# Patient Record
Sex: Male | Born: 1945
Health system: Southern US, Community
[De-identification: ages and names within clinical notes are randomized; demographics above are authoritative.]

## PROBLEM LIST (undated history)

## (undated) DIAGNOSIS — Z973 Presence of spectacles and contact lenses: Secondary | ICD-10-CM

## (undated) DIAGNOSIS — Z974 Presence of external hearing-aid: Secondary | ICD-10-CM

## (undated) DIAGNOSIS — E039 Hypothyroidism, unspecified: Secondary | ICD-10-CM

## (undated) DIAGNOSIS — S83206A Unspecified tear of unspecified meniscus, current injury, right knee, initial encounter: Secondary | ICD-10-CM

## (undated) HISTORY — PX: APPENDECTOMY: SHX54

## (undated) HISTORY — PX: TONSILLECTOMY: SUR1361

---

## 2004-12-18 ENCOUNTER — Ambulatory Visit (HOSPITAL_COMMUNITY): Admission: RE | Admit: 2004-12-18 | Discharge: 2004-12-18 | Payer: Self-pay | Admitting: *Deleted

## 2014-08-28 ENCOUNTER — Ambulatory Visit: Payer: Self-pay | Admitting: Orthopedic Surgery

## 2014-09-03 ENCOUNTER — Ambulatory Visit: Payer: Self-pay | Admitting: Orthopedic Surgery

## 2014-09-03 NOTE — H&P (Signed)
Dale BoastJames Miller is an 68 y.o. male.   Chief Complaint: R knee pain HPI: The patient is a 68 year old male who presents with knee complaints. The patient is seen today in referral from Dr. Zachery DauerBarnes. The patient reports right knee symptoms including: pain which began 4 week(s) (1/2) ago without any known injury. The patient describes the severity of the symptoms as mild (to severe). The patient describes their pain as aching. Prior to being seen today the patient was previously evaluated by by Dr. Zachery DauerBarnes. Previous work-up for this problem has included knee x-rays and knee MRI. Past treatment for this problem has included knee brace, intra-articular injection of corticosteroids and opioid analgesics. Symptoms are reported to be located in the right medial knee. Symptoms are exacerbated by weight bearing and walking. Symptoms are relieved by opioid analgesics. Current treatment includes knee brace and opioid analgesics (Norco).  No past medical history on file.  No past surgical history on file.  No family history on file. Social History:  has no tobacco, alcohol, and drug history on file.  Allergies: Allergies not on file   (Not in a hospital admission)  No results found for this or any previous visit (from the past 48 hour(s)). No results found.  Review of Systems  Constitutional: Negative.   HENT: Negative.   Eyes: Negative.   Respiratory: Negative.   Cardiovascular: Negative.   Gastrointestinal: Negative.   Genitourinary: Negative.   Musculoskeletal: Positive for joint pain.  Skin: Negative.   Neurological: Negative.   Psychiatric/Behavioral: Negative.     There were no vitals taken for this visit. Physical Exam  Constitutional: He is oriented to person, place, and time. He appears well-developed and well-nourished.  HENT:  Head: Normocephalic and atraumatic.  Eyes: Conjunctivae and EOM are normal. Pupils are equal, round, and reactive to light.  Neck: Normal range of motion. Neck  supple.  Cardiovascular: Normal rate and regular rhythm.   Respiratory: Effort normal and breath sounds normal.  GI: Soft. Bowel sounds are normal.  Musculoskeletal:  Tender R knee medial jt line Painful McMurrays PF crepitus Antalgic gait  Neurological: He is alert and oriented to person, place, and time. He has normal reflexes.  Skin: Skin is warm and dry.  Psychiatric: He has a normal mood and affect.    MRI R knee with MMT, full thickness chondral erosion posterior medial femoral condyle, weightbearing surface, and patella. Small free edge LMT.  Assessment/Plan R knee MMT  Dr. Shelle IronBeane had a long discussion with the patient concerning the risks and benefits of knee arthroscopy including help from the arthroscopic procedure as well as no help from the arthroscopic procedure or worsening of symptoms. Also discussed infection, DVT, PE, anesthetic complications, etc. Also discussed the possibility of repeat arthroscopic surgery required in the future or total knee replacement. I provided the patient with an illustrated handout and discussed it in detail as well as discussed the postoperative and perioperative courses and return to functional activities including work. Need for postoperative DVT prophylaxis was discussed as well.  Plan R knee arthroscopy, partial medial meniscectomy  Myrissa Chipley M. PA-C for Dr. Shelle IronBeane 09/03/2014, 1:07 PM

## 2014-09-04 ENCOUNTER — Encounter (HOSPITAL_BASED_OUTPATIENT_CLINIC_OR_DEPARTMENT_OTHER): Payer: Self-pay | Admitting: *Deleted

## 2014-09-04 NOTE — Progress Notes (Signed)
NPO AFTER MN WITH EXCEPTION CLEAR LIQUIDS UNTIL 0930 (NO CREAM/ MILK PRODUCTS) .  ARRIVE AT 1330. NEEDS HG. MAY TAKE HYDROCODONE DOS W/ SIPS OF WATER.

## 2014-09-06 ENCOUNTER — Encounter (HOSPITAL_BASED_OUTPATIENT_CLINIC_OR_DEPARTMENT_OTHER): Payer: 59 | Admitting: Anesthesiology

## 2014-09-06 ENCOUNTER — Encounter (HOSPITAL_BASED_OUTPATIENT_CLINIC_OR_DEPARTMENT_OTHER): Payer: Self-pay | Admitting: *Deleted

## 2014-09-06 ENCOUNTER — Ambulatory Visit (HOSPITAL_BASED_OUTPATIENT_CLINIC_OR_DEPARTMENT_OTHER)
Admission: RE | Admit: 2014-09-06 | Discharge: 2014-09-06 | Disposition: A | Discharge status: Home / Self Care | Payer: 59 | Source: Ambulatory Visit | Attending: Specialist | Admitting: Specialist

## 2014-09-06 ENCOUNTER — Ambulatory Visit (HOSPITAL_BASED_OUTPATIENT_CLINIC_OR_DEPARTMENT_OTHER): Payer: 59 | Admitting: Anesthesiology

## 2014-09-06 ENCOUNTER — Encounter (HOSPITAL_BASED_OUTPATIENT_CLINIC_OR_DEPARTMENT_OTHER)
Admission: RE | Disposition: A | Discharge status: Home / Self Care | Payer: Self-pay | Source: Ambulatory Visit | Attending: Specialist

## 2014-09-06 DIAGNOSIS — S83281A Other tear of lateral meniscus, current injury, right knee, initial encounter: Secondary | ICD-10-CM | POA: Diagnosis not present

## 2014-09-06 DIAGNOSIS — X58XXXA Exposure to other specified factors, initial encounter: Secondary | ICD-10-CM | POA: Insufficient documentation

## 2014-09-06 DIAGNOSIS — M1711 Unilateral primary osteoarthritis, right knee: Secondary | ICD-10-CM

## 2014-09-06 DIAGNOSIS — S83241A Other tear of medial meniscus, current injury, right knee, initial encounter: Secondary | ICD-10-CM | POA: Diagnosis not present

## 2014-09-06 DIAGNOSIS — M94261 Chondromalacia, right knee: Secondary | ICD-10-CM | POA: Insufficient documentation

## 2014-09-06 HISTORY — PX: KNEE ARTHROSCOPY WITH MEDIAL MENISECTOMY: SHX5651

## 2014-09-06 HISTORY — DX: Unspecified tear of unspecified meniscus, current injury, right knee, initial encounter: S83.206A

## 2014-09-06 HISTORY — DX: Presence of spectacles and contact lenses: Z97.3

## 2014-09-06 HISTORY — DX: Presence of external hearing-aid: Z97.4

## 2014-09-06 LAB — POCT HEMOGLOBIN-HEMACUE: HEMOGLOBIN: 15.9 g/dL (ref 13.0–17.0)

## 2014-09-06 SURGERY — ARTHROSCOPY, KNEE, WITH MEDIAL MENISCECTOMY
Anesthesia: General | Site: Knee | Laterality: Right

## 2014-09-06 MED ORDER — LACTATED RINGERS IV SOLN
INTRAVENOUS | Status: DC | PRN
  Administered 2014-09-06: 14:00:00 via INTRAVENOUS

## 2014-09-06 MED ORDER — LACTATED RINGERS IV SOLN
INTRAVENOUS | Status: DC
  Filled 2014-09-06: qty 1000

## 2014-09-06 MED ORDER — FENTANYL CITRATE 0.05 MG/ML IJ SOLN
INTRAMUSCULAR | Status: DC | PRN
  Administered 2014-09-06 (×8): 25 ug via INTRAVENOUS

## 2014-09-06 MED ORDER — FENTANYL CITRATE 0.05 MG/ML IJ SOLN
25.0000 ug | INTRAMUSCULAR | Status: DC | PRN
  Filled 2014-09-06: qty 1

## 2014-09-06 MED ORDER — EPINEPHRINE HCL 1 MG/ML IJ SOLN
INTRAMUSCULAR | Status: DC | PRN
  Administered 2014-09-06: 2 mg

## 2014-09-06 MED ORDER — KETOROLAC TROMETHAMINE 30 MG/ML IJ SOLN
INTRAMUSCULAR | Status: DC | PRN
  Administered 2014-09-06: 30 mg via INTRAVENOUS

## 2014-09-06 MED ORDER — DEXAMETHASONE SODIUM PHOSPHATE 4 MG/ML IJ SOLN
INTRAMUSCULAR | Status: DC | PRN
  Administered 2014-09-06: 10 mg via INTRAVENOUS

## 2014-09-06 MED ORDER — LIDOCAINE HCL (CARDIAC) 20 MG/ML IV SOLN
INTRAVENOUS | Status: DC | PRN
  Administered 2014-09-06: 100 mg via INTRAVENOUS

## 2014-09-06 MED ORDER — ONDANSETRON HCL 4 MG/2ML IJ SOLN
INTRAMUSCULAR | Status: DC | PRN
Start: 2014-09-06 — End: 2014-09-06
  Administered 2014-09-06: 4 mg via INTRAVENOUS

## 2014-09-06 MED ORDER — OXYCODONE-ACETAMINOPHEN 5-325 MG PO TABS: 1.0000 | ORAL_TABLET | ORAL | Status: DC | PRN

## 2014-09-06 MED ORDER — LACTATED RINGERS IV SOLN
INTRAVENOUS | Status: DC
  Administered 2014-09-06: 14:00:00 via INTRAVENOUS
  Filled 2014-09-06: qty 1000

## 2014-09-06 MED ORDER — SODIUM CHLORIDE 0.9 % IR SOLN
Status: DC | PRN
  Administered 2014-09-06: 6000 mL

## 2014-09-06 MED ORDER — ACETAMINOPHEN 10 MG/ML IV SOLN
INTRAVENOUS | Status: DC | PRN
  Administered 2014-09-06: 1000 mg via INTRAVENOUS

## 2014-09-06 MED ORDER — CEFAZOLIN SODIUM-DEXTROSE 2-3 GM-% IV SOLR
INTRAVENOUS | Status: AC
  Filled 2014-09-06: qty 50

## 2014-09-06 MED ORDER — CEFAZOLIN SODIUM-DEXTROSE 2-3 GM-% IV SOLR
2.0000 g | INTRAVENOUS | Status: AC
  Administered 2014-09-06: 2 g via INTRAVENOUS
  Filled 2014-09-06: qty 50

## 2014-09-06 MED ORDER — PROPOFOL 10 MG/ML IV BOLUS
INTRAVENOUS | Status: DC | PRN
  Administered 2014-09-06: 200 mg via INTRAVENOUS

## 2014-09-06 MED ORDER — MIDAZOLAM HCL 5 MG/5ML IJ SOLN
INTRAMUSCULAR | Status: DC | PRN
  Administered 2014-09-06 (×2): 1 mg via INTRAVENOUS

## 2014-09-06 MED ORDER — BUPIVACAINE-EPINEPHRINE 0.5% -1:200000 IJ SOLN
INTRAMUSCULAR | Status: DC | PRN
  Administered 2014-09-06: 30 mL

## 2014-09-06 MED ORDER — FENTANYL CITRATE 0.05 MG/ML IJ SOLN
INTRAMUSCULAR | Status: AC
  Filled 2014-09-06: qty 4

## 2014-09-06 MED ORDER — MIDAZOLAM HCL 2 MG/2ML IJ SOLN
INTRAMUSCULAR | Status: AC
  Filled 2014-09-06: qty 2

## 2014-09-06 SURGICAL SUPPLY — 44 items
BANDAGE ELASTIC 6 VELCRO ST LF (GAUZE/BANDAGES/DRESSINGS) ×3 IMPLANT
BLADE 4.2CUDA (BLADE) IMPLANT
BLADE CUDA SHAVER 3.5 (BLADE) ×3 IMPLANT
BLADE GREAT WHITE 4.2 (BLADE) IMPLANT
BLADE GREAT WHITE 4.2MM (BLADE)
BNDG COHESIVE 6X5 TAN NS LF (GAUZE/BANDAGES/DRESSINGS) ×3 IMPLANT
BOOTIES KNEE HIGH SLOAN (MISCELLANEOUS) ×3 IMPLANT
CANISTER SUCT LVC 12 LTR MEDI- (MISCELLANEOUS) ×3 IMPLANT
CANISTER SUCTION 1200CC (MISCELLANEOUS) IMPLANT
CANISTER SUCTION 2500CC (MISCELLANEOUS) IMPLANT
CANNULA ACUFLEX KIT 5X76 (CANNULA) IMPLANT
CLOTH BEACON ORANGE TIMEOUT ST (SAFETY) ×3 IMPLANT
CUTTER MENISCUS  4.2MM (BLADE)
CUTTER MENISCUS 4.2MM (BLADE) IMPLANT
DRAPE ARTHROSCOPY W/POUCH 114 (DRAPES) ×3 IMPLANT
DURAPREP 26ML APPLICATOR (WOUND CARE) ×3 IMPLANT
ELECT REM PT RETURN 9FT ADLT (ELECTROSURGICAL)
ELECTRODE REM PT RTRN 9FT ADLT (ELECTROSURGICAL) IMPLANT
GLOVE BIO SURGEON STRL SZ7.5 (GLOVE) ×3 IMPLANT
GLOVE SURG SS PI 7.5 STRL IVOR (GLOVE) ×3 IMPLANT
GLOVE SURG SS PI 8.0 STRL IVOR (GLOVE) ×3 IMPLANT
GOWN STRL REUS W/ TWL XL LVL3 (GOWN DISPOSABLE) ×2 IMPLANT
GOWN STRL REUS W/TWL XL LVL3 (GOWN DISPOSABLE) ×6
IV NS IRRIG 3000ML ARTHROMATIC (IV SOLUTION) ×6 IMPLANT
KNEE WRAP E Z 3 GEL PACK (MISCELLANEOUS) ×3 IMPLANT
MINI VAC (SURGICAL WAND) IMPLANT
NDL SAFETY ECLIPSE 18X1.5 (NEEDLE) ×1 IMPLANT
NEEDLE FILTER BLUNT 18X 1/2SAF (NEEDLE) ×2
NEEDLE FILTER BLUNT 18X1 1/2 (NEEDLE) ×1 IMPLANT
NEEDLE HYPO 18GX1.5 SHARP (NEEDLE) ×3
PACK ARTHROSCOPY DSU (CUSTOM PROCEDURE TRAY) ×3 IMPLANT
PACK BASIN DAY SURGERY FS (CUSTOM PROCEDURE TRAY) ×3 IMPLANT
PADDING CAST COTTON 6X4 STRL (CAST SUPPLIES) ×3 IMPLANT
RESECTOR FULL RADIUS 4.2MM (BLADE) IMPLANT
SET ARTHROSCOPY TUBING (MISCELLANEOUS) ×2
SET ARTHROSCOPY TUBING LN (MISCELLANEOUS) ×1 IMPLANT
SPONGE GAUZE 4X4 12PLY (GAUZE/BANDAGES/DRESSINGS) ×3 IMPLANT
SPONGE GAUZE 4X4 12PLY STER LF (GAUZE/BANDAGES/DRESSINGS) ×3 IMPLANT
SUT ETHILON 4 0 PS 2 18 (SUTURE) ×3 IMPLANT
SYR 30ML LL (SYRINGE) ×3 IMPLANT
SYRINGE 10CC LL (SYRINGE) ×3 IMPLANT
TOWEL OR 17X24 6PK STRL BLUE (TOWEL DISPOSABLE) ×3 IMPLANT
WAND 90 DEG TURBOVAC W/CORD (SURGICAL WAND) IMPLANT
WATER STERILE IRR 500ML POUR (IV SOLUTION) ×3 IMPLANT

## 2014-09-06 NOTE — Brief Op Note (Signed)
09/06/2014  4:13 PM  PATIENT:  Dale BoastJames Dier  68 y.o. male  PRE-OPERATIVE DIAGNOSIS:  right knee medial meniscal tear  POST-OPERATIVE DIAGNOSIS:  right knee medial meniscal tear  PROCEDURE:  Procedure(s): RIGHT KNEE ARTHROSCOPY WITH PARTIAL MEDIAL AND LATERAL MENISECTOMY WITH DEBRIDEMENT (Right)  SURGEON:  Surgeon(s) and Role:    * Javier DockerJeffrey C Xaine Sansom, MD - Primary  PHYSICIAN ASSISTANT:   ASSISTANTS: none   ANESTHESIA:   general  EBL:  Total I/O In: 200 [I.V.:200] Out: -   BLOOD ADMINISTERED:none  DRAINS: none   LOCAL MEDICATIONS USED:  MARCAINE     SPECIMEN:  No Specimen  DISPOSITION OF SPECIMEN:  N/A  COUNTS:  YES  TOURNIQUET:  * No tourniquets in log *  DICTATION: .Other Dictation: Dictation Number [id number]  PLAN OF CARE: Discharge to home after PACU  PATIENT DISPOSITION:  PACU - hemodynamically stable.   Delay start of Pharmacological VTE agent (>24hrs) due to surgical blood loss or risk of bleeding: no

## 2014-09-06 NOTE — Discharge Instructions (Signed)
ARTHROSCOPIC KNEE SURGERY HOME CARE INSTRUCTIONS ° ° °PAIN °You will be expected to have a moderate amount of pain in the affected knee for approximately two weeks.  However, the first two to four days will be the most severe in terms of the pain you will experience.  Prescriptions have been provided for you to take as needed for the pain.  The pain can be markedly reduced by using the ice/compressive bandage given.  Exchange the ice packs whenever they thaw.  During the night, keep the bandage on because it will still provide some compression for the swelling.  Also, keep the leg elevated on pillows above your heart, and this will help alleviate the pain and swelling. ° °MEDICATION °Prescriptions have been provided to take as needed for pain. To prevent blood clots, take Aspirin 325mg daily with a meal if not on a blood thinner and if no history of stomach ulcers. ° °ACTIVITY °It is preferred that you stay on bedrest for approximately 24 hours.  However, you may go to the bathroom with help.  After this, you can start to be up and about progressively more.  Remember that the swelling may still increase after three to four days if you are up and doing too much.  You may put as much weight on the affected leg as pain will allow.  Use your crutches for comfort and safety.  However, as soon as you are able, you may discard the crutches and go without them.  ° °DRESSING °Keep the current dressing as dry as possible.  Two days after your surgery, you may remove the ice/compressive wrap, and surgical dressing.  You may now take a shower, but do not scrub the sounds directly with soap.  Let water rinse over these and gently wipe with your hand.  Reapply band-aids over the puncture wounds and more gauze if needed.  A slight amount of thin drainage can be normal at this time, and do not let it frighten you.  Reapply the ice/compressive wrap.  You may now repeat this every day each time you shower. ° °SYMPTOMS TO REPORT TO  YOUR DOCTOR ° -Extreme pain. ° -Extreme swelling. ° -Temperature above 101 degrees that does not come down with acetaminophen     (Tylenol). ° -Any changes in the feeling, color or movement of your toes. ° -Extreme redness, heat, swelling or drainage at your incision ° °EXERCISE °It is preferred that you begin to exercise on the day of your surgery.  Straight leg raises and short arc quads should be begun the afternoon or evening of surgery and continued until you come back for your follow-up appointment.   Attached is an instruction sheet on how to perform these two simple exercises.  Do these at least three times per day if not more.  You may bend your knee as much as is comfortable.  The puncture wounds may occasionally be slightly uncomfortable with bending of the knee.  Do not let this frighten you.  It is important to keep your knee motion, but do not overdo it.  If you have significant pain, simply do not bend the knee as far.   You will be given more exercises to perform at your first return visit.   ° °RETURN APPOINTMENT °Please make an appointment to be seen by your doctor in 14 days from your surgery. ° °Patient Signature:  ________________________________________________________ ° °Nurse's Signature:  ________________________________________________________ ° ° °Post Anesthesia Home Care Instructions ° °Activity: °Get plenty of   rest for the remainder of the day. A responsible adult should stay with you for 24 hours following the procedure.  °For the next 24 hours, DO NOT: °-Drive a car °-Operate machinery °-Drink alcoholic beverages °-Take any medication unless instructed by your physician °-Make any legal decisions or sign important papers. ° °Meals: °Start with liquid foods such as gelatin or soup. Progress to regular foods as tolerated. Avoid greasy, spicy, heavy foods. If nausea and/or vomiting occur, drink only clear liquids until the nausea and/or vomiting subsides. Call your physician if vomiting  continues. ° °Special Instructions/Symptoms: °Your throat may feel dry or sore from the anesthesia or the breathing tube placed in your throat during surgery. If this causes discomfort, gargle with warm salt water. The discomfort should disappear within 24 hours. ° °

## 2014-09-06 NOTE — H&P (View-Only) (Signed)
Dale Miller is an 68 y.o. male.   Chief Complaint: R knee pain HPI: The patient is a 68 year old male who presents with knee complaints. The patient is seen today in referral from Dr. Barnes. The patient reports right knee symptoms including: pain which began 4 week(s) (1/2) ago without any known injury. The patient describes the severity of the symptoms as mild (to severe). The patient describes their pain as aching. Prior to being seen today the patient was previously evaluated by by Dr. Barnes. Previous work-up for this problem has included knee x-rays and knee MRI. Past treatment for this problem has included knee brace, intra-articular injection of corticosteroids and opioid analgesics. Symptoms are reported to be located in the right medial knee. Symptoms are exacerbated by weight bearing and walking. Symptoms are relieved by opioid analgesics. Current treatment includes knee brace and opioid analgesics (Norco).  No past medical history on file.  No past surgical history on file.  No family history on file. Social History:  has no tobacco, alcohol, and drug history on file.  Allergies: Allergies not on file   (Not in a hospital admission)  No results found for this or any previous visit (from the past 48 hour(s)). No results found.  Review of Systems  Constitutional: Negative.   HENT: Negative.   Eyes: Negative.   Respiratory: Negative.   Cardiovascular: Negative.   Gastrointestinal: Negative.   Genitourinary: Negative.   Musculoskeletal: Positive for joint pain.  Skin: Negative.   Neurological: Negative.   Psychiatric/Behavioral: Negative.     There were no vitals taken for this visit. Physical Exam  Constitutional: He is oriented to person, place, and time. He appears well-developed and well-nourished.  HENT:  Head: Normocephalic and atraumatic.  Eyes: Conjunctivae and EOM are normal. Pupils are equal, round, and reactive to light.  Neck: Normal range of motion. Neck  supple.  Cardiovascular: Normal rate and regular rhythm.   Respiratory: Effort normal and breath sounds normal.  GI: Soft. Bowel sounds are normal.  Musculoskeletal:  Tender R knee medial jt line Painful McMurrays PF crepitus Antalgic gait  Neurological: He is alert and oriented to person, place, and time. He has normal reflexes.  Skin: Skin is warm and dry.  Psychiatric: He has a normal mood and affect.    MRI R knee with MMT, full thickness chondral erosion posterior medial femoral condyle, weightbearing surface, and patella. Small free edge LMT.  Assessment/Plan R knee MMT  Dr. Beane had a long discussion with the patient concerning the risks and benefits of knee arthroscopy including help from the arthroscopic procedure as well as no help from the arthroscopic procedure or worsening of symptoms. Also discussed infection, DVT, PE, anesthetic complications, etc. Also discussed the possibility of repeat arthroscopic surgery required in the future or total knee replacement. I provided the patient with an illustrated handout and discussed it in detail as well as discussed the postoperative and perioperative courses and return to functional activities including work. Need for postoperative DVT prophylaxis was discussed as well.  Plan R knee arthroscopy, partial medial meniscectomy  BISSELL, JACLYN M. PA-C for Dr. Beane 09/03/2014, 1:07 PM    

## 2014-09-06 NOTE — Anesthesia Procedure Notes (Signed)
Procedure Name: LMA Insertion Date/Time: 09/06/2014 3:31 PM Performed by: Jessica PriestBEESON, Azar South C Pre-anesthesia Checklist: Patient identified, Emergency Drugs available, Suction available and Patient being monitored Patient Re-evaluated:Patient Re-evaluated prior to inductionOxygen Delivery Method: Circle System Utilized Preoxygenation: Pre-oxygenation with 100% oxygen Intubation Type: IV induction Ventilation: Mask ventilation without difficulty LMA: LMA inserted LMA Size: 4.0 Number of attempts: 1 Airway Equipment and Method: bite block Placement Confirmation: positive ETCO2 Tube secured with: Tape Dental Injury: Teeth and Oropharynx as per pre-operative assessment

## 2014-09-06 NOTE — Anesthesia Preprocedure Evaluation (Addendum)
Anesthesia Evaluation  Patient identified by MRN, date of birth, ID band Patient awake    Reviewed: Allergy & Precautions, H&P , NPO status , Patient's Chart, lab work & pertinent test results  Airway Mallampati: II  TM Distance: >3 FB Neck ROM: full    Dental no notable dental hx. (+) Teeth Intact, Dental Advisory Given   Pulmonary neg pulmonary ROS,  breath sounds clear to auscultation  Pulmonary exam normal       Cardiovascular Exercise Tolerance: Good negative cardio ROS  Rhythm:regular Rate:Normal     Neuro/Psych negative neurological ROS  negative psych ROS   GI/Hepatic negative GI ROS, Neg liver ROS,   Endo/Other  negative endocrine ROS  Renal/GU negative Renal ROS  negative genitourinary   Musculoskeletal   Abdominal   Peds  Hematology negative hematology ROS (+)   Anesthesia Other Findings   Reproductive/Obstetrics negative OB ROS                             Anesthesia Physical Anesthesia Plan  ASA: I  Anesthesia Plan: General   Post-op Pain Management:    Induction: Intravenous  Airway Management Planned: LMA  Additional Equipment:   Intra-op Plan:   Post-operative Plan:   Informed Consent: I have reviewed the patients History and Physical, chart, labs and discussed the procedure including the risks, benefits and alternatives for the proposed anesthesia with the patient or authorized representative who has indicated his/her understanding and acceptance.   Dental Advisory Given  Plan Discussed with: CRNA and Surgeon  Anesthesia Plan Comments:         Anesthesia Quick Evaluation  

## 2014-09-06 NOTE — Transfer of Care (Signed)
Immediate Anesthesia Transfer of Care Note  Patient: Dale Miller  Procedure(s) Performed: Procedure(s) (LRB): RIGHT KNEE ARTHROSCOPY WITH PARTIAL MEDIAL AND LATERAL MENISECTOMY WITH DEBRIDEMENT (Right)  Patient Location: PACU  Anesthesia Type: General  Level of Consciousness: awake, sedated, patient cooperative and responds to stimulation  Airway & Oxygen Therapy: Patient Spontanous Breathing and Patient connected to face mask oxygen  Post-op Assessment: Report given to PACU RN, Post -op Vital signs reviewed and stable and Patient moving all extremities  Post vital signs: Reviewed and stable  Complications: No apparent anesthesia complications

## 2014-09-06 NOTE — Interval H&P Note (Signed)
History and Physical Interval Note:  09/06/2014 1:00 PM  Dale Miller  has presented today for surgery, with the diagnosis of right knee medial meniscal tear  The various methods of treatment have been discussed with the patient and family. After consideration of risks, benefits and other options for treatment, the patient has consented to  Procedure(s): RIGHT KNEE ARTHROSCOPY WITH PARTIAL MEDIAL MENISECTOMY (Right) as a surgical intervention .  The patient's history has been reviewed, patient examined, no change in status, stable for surgery.  I have reviewed the patient's chart and labs.  Questions were answered to the patient's satisfaction.     Deadrian Toya C

## 2014-09-08 NOTE — Anesthesia Postprocedure Evaluation (Signed)
  Anesthesia Post-op Note  Patient: Dale Miller  Procedure(s) Performed: Procedure(s) (LRB): RIGHT KNEE ARTHROSCOPY WITH PARTIAL MEDIAL AND LATERAL MENISECTOMY WITH DEBRIDEMENT (Right)  Patient Location: PACU  Anesthesia Type: General  Level of Consciousness: awake and alert   Airway and Oxygen Therapy: Patient Spontanous Breathing  Post-op Pain: mild  Post-op Assessment: Post-op Vital signs reviewed, Patient's Cardiovascular Status Stable, Respiratory Function Stable, Patent Airway and No signs of Nausea or vomiting  Last Vitals:  Filed Vitals:   09/06/14 1800  BP: 153/74  Pulse: 70  Temp: 36.7 C  Resp: 18    Post-op Vital Signs: stable   Complications: No apparent anesthesia complications

## 2014-09-09 ENCOUNTER — Encounter (HOSPITAL_BASED_OUTPATIENT_CLINIC_OR_DEPARTMENT_OTHER): Payer: Self-pay | Admitting: Specialist

## 2014-09-09 NOTE — Op Note (Signed)
NAME:  Geralyn FlashROBSON, Jonpaul                ACCOUNT NO.:  [id number]  MEDICAL RECORD NO.:  [id number]  LOCATION:                                 FACILITY:  PHYSICIAN:  Jene EveryJeffrey Zooey Schreurs, M.D.    DATE OF BIRTH:  1946-08-19  DATE OF PROCEDURE:  09/06/2014 DATE OF DISCHARGE:  09/06/2014                              OPERATIVE REPORT   PREOPERATIVE DIAGNOSIS:  Medial meniscus tear of the right knee.  POSTOPERATIVE DIAGNOSIS:  Medial meniscus tear of the right knee, grade 3 chondromalacia, medial femoral condyle, medial tibial plateau and patellofemoral joint.  PROCEDURE PERFORMED:  Right knee arthroscopy, partial medial meniscectomy, chondroplasty, medial femoral condyle, patella.  ANESTHESIA:  General.  ASSISTANT:  No assistant.  HISTORY:  A 68, locking, popping, giving way secondary to medial meniscal tear, confirmed by MRI indicated for arthroscopy and partial medial meniscectomy.  Risks and benefits discussed including bleeding, infection, damage to neurovascular structures, DVT, PE, anesthetic complications, etc.  TECHNIQUE:  Patient in supine position, after induction of adequate general anesthesia, 2 g Kefzol, the right lower extremity was prepped and draped in usual sterile fashion.  A lateral parapatellar portal was fashioned with a #11 blade.  Ingress cannula atraumatically placed. Irrigant was utilized to insufflate the joint.  Under direct visualization, a medial parapatellar portal was fashioned with a #11 blade after localization with 18-gauge needle sparing the medial meniscus, complex tear of the posterior third of the medial meniscus was noted.  I introduced a basket and resected 50% of the posterior half, further contoured with 3.5 Cuda shaver to a stable base.  There was a large chondral defect in medial femoral condyle, light chondroplasty performed.  Mild chondral changes in tibial plateau, light chondroplasty there.  ACL was unremarkable.  Lateral compartment  revealed tearing of the anterior horn lateral meniscus that shaved this to a stable base.  Chondral surfaces were relatively unremarkable.  Suprapatellar pouch grade 3 changes of patella, light chondroplasty performed here.  Normal patellofemoral tracking.  Gutters were unremarkable, revisited all compartments.  No further pathology amenable to arthroscopic intervention.  I, therefore, removed all instrumentation.  Portals were closed with 4-0 nylon simple sutures 0.25% Marcaine with epinephrine was infiltrated in the joint.  Wound was dressed sterilely, awoken without difficulty, and transported to the recovery room in satisfactory condition.  The patient tolerated the procedure well.  No complications.  No assistant.  Minimal blood loss.     Jene EveryJeffrey Hamilton Marinello, M.D.     Cordelia PenJB/MEDQ  D:  09/06/2014  T:  09/07/2014  Job:  [id number]

## 2017-02-03 DIAGNOSIS — R1013 Epigastric pain: Secondary | ICD-10-CM | POA: Diagnosis not present

## 2017-02-03 DIAGNOSIS — R5383 Other fatigue: Secondary | ICD-10-CM | POA: Diagnosis not present

## 2017-02-08 DIAGNOSIS — M1711 Unilateral primary osteoarthritis, right knee: Secondary | ICD-10-CM | POA: Diagnosis not present

## 2017-02-08 DIAGNOSIS — M1712 Unilateral primary osteoarthritis, left knee: Secondary | ICD-10-CM | POA: Diagnosis not present

## 2017-02-08 DIAGNOSIS — M170 Bilateral primary osteoarthritis of knee: Secondary | ICD-10-CM | POA: Diagnosis not present

## 2017-03-21 DIAGNOSIS — H02839 Dermatochalasis of unspecified eye, unspecified eyelid: Secondary | ICD-10-CM | POA: Diagnosis not present

## 2017-03-21 DIAGNOSIS — H21233 Degeneration of iris (pigmentary), bilateral: Secondary | ICD-10-CM | POA: Diagnosis not present

## 2017-03-21 DIAGNOSIS — H2512 Age-related nuclear cataract, left eye: Secondary | ICD-10-CM | POA: Diagnosis not present

## 2017-03-29 DIAGNOSIS — H2512 Age-related nuclear cataract, left eye: Secondary | ICD-10-CM | POA: Diagnosis not present

## 2017-04-05 DIAGNOSIS — Z13220 Encounter for screening for lipoid disorders: Secondary | ICD-10-CM | POA: Diagnosis not present

## 2017-04-05 DIAGNOSIS — Z0000 Encounter for general adult medical examination without abnormal findings: Secondary | ICD-10-CM | POA: Diagnosis not present

## 2017-06-08 DIAGNOSIS — M1711 Unilateral primary osteoarthritis, right knee: Secondary | ICD-10-CM | POA: Diagnosis not present

## 2017-06-08 DIAGNOSIS — M1712 Unilateral primary osteoarthritis, left knee: Secondary | ICD-10-CM | POA: Diagnosis not present

## 2017-06-14 ENCOUNTER — Ambulatory Visit: Payer: Self-pay | Admitting: Orthopedic Surgery

## 2017-06-29 NOTE — Patient Instructions (Addendum)
Dale BoastJames Miller  06/29/2017   Your procedure is scheduled on: 07-07-17   [facility]  [facility] to 3rd floor to  Short Stay Center at 8:15 AM.   Call this number if you have problems the morning of surgery [phone]    Remember: ONLY 1 PERSON MAY GO WITH YOU TO SHORT STAY TO GET  READY MORNING OF YOUR SURGERY.  Do not eat food or drink liquids :After Midnight.     Take these medicines the morning of surgery with A SIP OF WATER: Synthroid                                You may not have any metal on your body including hair pins and              piercings  Do not wear jewelry, make-up, lotions, powders or perfumes, deodorant             Men may shave face and neck.   Do not bring valuables to the hospital. [place] IS NOT             RESPONSIBLE   FOR VALUABLES.  Contacts, dentures or bridgework may not be worn into surgery.  Leave suitcase in the car. After surgery it may be brought to your room.                 Please read over the following fact sheets you were given: _____________________________________________________________________           [facility] Before surgery, you can play an important role.  Because skin is not sterile, your skin needs to be as free of germs as possible.  You can reduce the number of germs on your skin by washing with CHG (chlorahexidine gluconate) soap before surgery.  CHG is an antiseptic cleaner which kills germs and bonds with the skin to continue killing germs even after washing. Please DO NOT use if you have an allergy to CHG or antibacterial soaps.  If your skin becomes reddened/irritated stop using the CHG and inform your nurse when you arrive at Short Stay. Do not shave (including legs and underarms) for at least 48 hours prior to the first CHG shower.  You may shave your face/neck. Please follow these instructions carefully:  1.  Shower with CHG Soap the  night before surgery and the  morning of Surgery.  2.  If you choose to wash your hair, wash your hair first as usual with your  normal  shampoo.  3.  After you shampoo, rinse your hair and body thoroughly to remove the  shampoo.                           4.  Use CHG as you would any other liquid soap.  You can apply chg directly  to the skin and wash                       Gently with a scrungie or clean washcloth.  5.  Apply the CHG Soap to your body ONLY FROM THE NECK DOWN.   Do not use on face/ open  Wound or open sores. Avoid contact with eyes, ears mouth and genitals (private parts).                       Wash face,  Genitals (private parts) with your normal soap.             6.  Wash thoroughly, paying special attention to the area where your surgery  will be performed.  7.  Thoroughly rinse your body with warm water from the neck down.  8.  DO NOT shower/wash with your normal soap after using and rinsing off  the CHG Soap.                9.  Pat yourself dry with a clean towel.            10.  Wear clean pajamas.            11.  Place clean sheets on your bed the night of your first shower and do not  sleep with pets. Day of Surgery : Do not apply any lotions/deodorants the morning of surgery.  Please wear clean clothes to the hospital/surgery center.  FAILURE TO FOLLOW THESE INSTRUCTIONS MAY RESULT IN THE CANCELLATION OF YOUR SURGERY PATIENT SIGNATURE_________________________________  NURSE SIGNATURE__________________________________  ________________________________________________________________________   [facility]  An incentive spirometer is a tool that can help keep your lungs clear and active. This tool measures how well you are filling your lungs with each breath. Taking long deep breaths may help reverse or decrease the chance of developing breathing (pulmonary) problems (especially infection) following:  A long period of time when you  are unable to move or be active. BEFORE THE PROCEDURE   If the spirometer includes an indicator to show your best effort, your nurse or respiratory therapist will set it to a desired goal.  If possible, sit up straight or lean slightly forward. Try not to slouch.  Hold the incentive spirometer in an upright position. INSTRUCTIONS FOR USE  1. Sit on the edge of your bed if possible, or sit up as far as you can in bed or on a chair. 2. Hold the incentive spirometer in an upright position. 3. Breathe out normally. 4. Place the mouthpiece in your mouth and seal your lips tightly around it. 5. Breathe in slowly and as deeply as possible, raising the piston or the ball toward the top of the column. 6. Hold your breath for 3-5 seconds or for as long as possible. Allow the piston or ball to fall to the bottom of the column. 7. Remove the mouthpiece from your mouth and breathe out normally. 8. Rest for a few seconds and repeat Steps 1 through 7 at least 10 times every 1-2 hours when you are awake. Take your time and take a few normal breaths between deep breaths. 9. The spirometer may include an indicator to show your best effort. Use the indicator as a goal to work toward during each repetition. 10. After each set of 10 deep breaths, practice coughing to be sure your lungs are clear. If you have an incision (the cut made at the time of surgery), support your incision when coughing by placing a pillow or rolled up towels firmly against it. Once you are able to get out of bed, walk around indoors and cough well. You may stop using the incentive spirometer when instructed by your caregiver.  RISKS AND COMPLICATIONS  Take your time so you do not get  dizzy or light-headed.  If you are in pain, you may need to take or ask for pain medication before doing incentive spirometry. It is harder to take a deep breath if you are having pain. AFTER USE  Rest and breathe slowly and easily.  It can be helpful to  keep track of a log of your progress. Your caregiver can provide you with a simple table to help with this. If you are using the spirometer at home, follow these instructions: [place] IF:   You are having difficultly using the spirometer.  You have trouble using the spirometer as often as instructed.  Your pain medication is not giving enough relief while using the spirometer.  You develop fever of 100.5 F (38.1 C) or higher. SEEK IMMEDIATE MEDICAL CARE IF:   You cough up bloody sputum that had not been present before.  You develop fever of 102 F (38.9 C) or greater.  You develop worsening pain at or near the incision site. MAKE SURE YOU:   Understand these instructions.  Will watch your condition.  Will get help right away if you are not doing well or get worse. Document Released: 03/14/2007 Document Revised: 01/24/2012 Document Reviewed: 05/15/2007 [facility] Patient Information 2014 [place], [place].   ________________________________________________________________________

## 2017-06-30 ENCOUNTER — Encounter (INDEPENDENT_AMBULATORY_CARE_PROVIDER_SITE_OTHER): Payer: Self-pay

## 2017-06-30 ENCOUNTER — Encounter (HOSPITAL_COMMUNITY)
Admission: RE | Admit: 2017-06-30 | Discharge: 2017-06-30 | Disposition: A | Discharge status: Home / Self Care | Payer: 59 | Source: Ambulatory Visit | Attending: Specialist | Admitting: Specialist

## 2017-06-30 ENCOUNTER — Encounter (HOSPITAL_COMMUNITY): Payer: Self-pay

## 2017-06-30 ENCOUNTER — Ambulatory Visit: Payer: Self-pay | Admitting: Orthopedic Surgery

## 2017-06-30 DIAGNOSIS — Z01812 Encounter for preprocedural laboratory examination: Secondary | ICD-10-CM | POA: Insufficient documentation

## 2017-06-30 DIAGNOSIS — M1711 Unilateral primary osteoarthritis, right knee: Secondary | ICD-10-CM | POA: Diagnosis not present

## 2017-06-30 HISTORY — DX: Hypothyroidism, unspecified: E03.9

## 2017-06-30 LAB — PROTIME-INR
INR: 0.99
Prothrombin Time: 13.1 seconds (ref 11.4–15.2)

## 2017-06-30 LAB — BASIC METABOLIC PANEL
ANION GAP: 8 (ref 5–15)
BUN: 20 mg/dL (ref 6–20)
CHLORIDE: 106 mmol/L (ref 101–111)
CO2: 28 mmol/L (ref 22–32)
Calcium: 9.5 mg/dL (ref 8.9–10.3)
Creatinine, Ser: 1.17 mg/dL (ref 0.61–1.24)
GFR calc Af Amer: >60 mL/min (ref >60)
GFR calc non Af Amer: >60 mL/min (ref >60)
GLUCOSE: 101 mg/dL — AB (ref 65–99)
POTASSIUM: 5.3 mmol/L — AB (ref 3.5–5.1)
Sodium: 142 mmol/L (ref 135–145)

## 2017-06-30 LAB — URINALYSIS, ROUTINE W REFLEX MICROSCOPIC
Bilirubin Urine: NEGATIVE
GLUCOSE, UA: NEGATIVE mg/dL
Hgb urine dipstick: NEGATIVE
Ketones, ur: 5 mg/dL — AB
LEUKOCYTES UA: NEGATIVE
Nitrite: NEGATIVE
PROTEIN: NEGATIVE mg/dL
Specific Gravity, Urine: 1.023 (ref 1.005–1.030)
pH: 6 (ref 5.0–8.0)

## 2017-06-30 LAB — APTT: aPTT: 27 seconds (ref 24–36)

## 2017-06-30 LAB — CBC
HEMATOCRIT: 50 % (ref 39.0–52.0)
HEMOGLOBIN: 17.1 g/dL — AB (ref 13.0–17.0)
MCH: 32.3 pg (ref 26.0–34.0)
MCHC: 34.2 g/dL (ref 30.0–36.0)
MCV: 94.3 fL (ref 78.0–100.0)
Platelets: 184 10*3/uL (ref 150–400)
RBC: 5.3 MIL/uL (ref 4.22–5.81)
RDW: 12.7 % (ref 11.5–15.5)
WBC: 9.9 10*3/uL (ref 4.0–10.5)

## 2017-06-30 NOTE — H&P (Signed)
Dale Miller DOB: Aug 12, 1946 Married / Language: [facility] Miller / Race: White Male  H&P Date: 06/30/17  Chief Complaint: Right knee pain  History of Present Illness The patient is a 71 year old male who comes in today for a preoperative History and Physical. The patient is scheduled for a right total knee arthroplasty to be performed by Dr. Javier Docker, MD at [facility] on 07/07/17 .   Dale Miller reports ongoing, progressively worsening right knee pain refractory to conservative treatment including arthroscopy, steroid injections, viscous supplementation, bracing, quad strengthening, home exercise program, medications, relative rest and activity modification. Pain is interfering with his quality-of-life and activities of daily living at this point and he has elected to proceed with total knee replacement.  Dr. Shelle Miller and the patient mutually agreed to proceed with a total knee replacement. Risks and benefits of the procedure were discussed including stiffness, suboptimal range of motion, persistent pain, infection requiring removal of prosthesis and reinsertion, need for prophylactic antibiotics in the future, for example, dental procedures, possible need for manipulation, revision in the future and also anesthetic complications including DVT, PE, etc. We discussed the perioperative course, time in the hospital, postoperative recovery and the need for elevation to control swelling. We also discussed the predicted range of motion and the probability that squatting and kneeling would be unobtainable in the future. In addition, postoperative anticoagulation was discussed. We have obtained preoperative medical clearance as necessary. Provided illustrated handout and discussed it in detail. They will enroll in the total joint replacement educational forum at the hospital.  He had his preop appointment at the hospital this morning.  Problem List/Past Medical Hx Chondromalacia, patella, right  (M22.41)  Knee internal derangement, right (M23.91)  S/P Arthroscopic Partial Medial Menisectomy (Z61.096)  Acute pain of left knee (M25.562)  Encounter for care following Knee Arthroscopy (Z47.89)  Degeneration of meniscus of knee, right (M23.306)  Lumbar spine pain (M54.5)  Primary osteoarthritis of right knee (M17.11)  Spinal stenosis, lumbar (M48.061)  Derangement of posterior horn of medial meniscus of right knee (M23.321)  Lumbar DDD (M51.36)   Allergies No Known Drug Allergies [08/10/2014]:  Family History  First Degree Relatives  Cancer  Mother. Cerebrovascular Accident  Maternal Grandmother.  Social History  Tobacco use  Never smoker. 08/10/2014  Medication History Vitamin B12 (Oral) Specific strength unknown - Active. Prevagen (10MG  Capsule, Oral) Active. Multi Vitamin Daily (Oral) Active. Glucosamine Chondroit-Collagen (Oral) Active. Fish Oil (Oral) Specific strength unknown - Inactive. Levothyroxine Sodium (Oral) Specific strength unknown - Active. Medications Reconciled  Past Surgical History  Appendectomy  Tonsillectomy  Cataract Surgery  right  Physical Exam General Mental Status -Alert, cooperative and good historian. General Appearance-pleasant, Not in acute distress. Orientation-Oriented X3. Build & Nutrition-Well nourished and Well developed.  Head and Neck Head-normocephalic, atraumatic . Neck Global Assessment - supple, no bruit auscultated on the right, no bruit auscultated on the left.  Eye Pupil - Bilateral-Regular and Round. Motion - Bilateral-EOMI.  Chest and Lung Exam Auscultation Breath sounds - clear at anterior chest wall and clear at posterior chest wall. Adventitious sounds - No Adventitious sounds.  Cardiovascular Auscultation Rhythm - Regular rate and rhythm. Heart Sounds - S1 WNL and S2 WNL. Murmurs & Other Heart Sounds - Auscultation of the heart reveals - No  Murmurs.  Abdomen Palpation/Percussion Tenderness - Abdomen is non-tender to palpation. Rigidity (guarding) - Abdomen is soft. Auscultation Auscultation of the abdomen reveals - Bowel sounds normal.  Male Genitourinary Not done, not pertinent to  present illness  Musculoskeletal Antalgic gait with a varus thrust right knee. No assistive devices.  Right knee is tender to palpation medial joint line. Nontender lateral joint line, patella, patellar tendon, quad tendon, peroneal nerve or popliteal space. No calf pain or sign of DVT. No pain or laxity with varus or valgus stress. No instability noted on exam today. Mild patellofemoral pain and patellofemoral crepitus. No erythema, ecchymosis, increased warmth or any sign of infection. Trace effusion. Range of motion approximately 0-115.  Imaging xrays from 01/2017 again reviewed today by Dr. Shelle IronBeane. No fx, subluxation, dislocation, lytic or blastic lesions. Right knee with moderately severe medial joint space narrowing with kissing lesion at the medial joint space. Prior MRI reviewed (09/2015) with extensive cartilage loss medial femoral condyle.  Assessment & Plan Primary osteoarthritis of right knee (M17.11)  Pt with end-stage right knee DJD, bone-on-bone, refractory to conservative tx, scheduled for right total knee replacement by Dr. Shelle IronBeane 07/07/17. We again discussed the procedure itself as well as risks, complications and alternatives, including but not limited to DVT, PE, infx, bleeding, failure of procedure, need for secondary procedure including manipulation, nerve injury, ongoing pain/symptoms, anesthesia risk, even stroke or death. Also discussed typical post-op protocols, activity restrictions, need for PT, flexion/extension exercises, time out of work. Discussed need for DVT ppx post-op per protocol. Discussed dental ppx and infx prevention. Also discussed limitations post-operatively such as kneeling and squatting. All questions  were answered. Patient desires to proceed with surgery as scheduled. Will hold supplements, ASA and NSAIDs accordingly. Will remain NPO after MN night before surgery. Will present to [facility] for pre-op testing. Anticipate hospital stay to include at least 2 midnights given medical history and to ensure proper pain control. Plan ASA 325mg  BID for DVT ppx post-op. Plan pain medication, Robaxin, Colace, Miralax. Plan home with HHPT post-op with family members at home for assistance. Will follow up 10-14 days post-op for staple removal and xrays.  Plan right total knee replacement  Signed electronically by Dorothy SparkJaclyn M Bissell, PA-C for Dr. Shelle IronBeane

## 2017-07-01 ENCOUNTER — Ambulatory Visit: Payer: Self-pay | Admitting: Orthopedic Surgery

## 2017-07-01 LAB — SURGICAL PCR SCREEN
MRSA, PCR: POSITIVE — AB
STAPHYLOCOCCUS AUREUS: POSITIVE — AB

## 2017-07-01 NOTE — Progress Notes (Signed)
07-01-17 Contacted pt to advise that he was positive for MRSA, and that we needed to call in a prescription to [facility]. Pt verbalized that he had already been contact by Annice Pih from Dr. Ermelinda Das office and that she had already called in a prescription for him.

## 2017-07-05 DIAGNOSIS — E039 Hypothyroidism, unspecified: Secondary | ICD-10-CM | POA: Diagnosis not present

## 2017-07-07 ENCOUNTER — Encounter (HOSPITAL_COMMUNITY): Payer: Self-pay | Admitting: *Deleted

## 2017-07-07 ENCOUNTER — Inpatient Hospital Stay (HOSPITAL_COMMUNITY): Payer: 59

## 2017-07-07 ENCOUNTER — Inpatient Hospital Stay (HOSPITAL_COMMUNITY): Payer: 59 | Admitting: Anesthesiology

## 2017-07-07 ENCOUNTER — Encounter (HOSPITAL_COMMUNITY)
Admission: RE | Disposition: A | Discharge status: Home Health | Payer: Self-pay | Source: Ambulatory Visit | Attending: Specialist

## 2017-07-07 ENCOUNTER — Inpatient Hospital Stay (HOSPITAL_COMMUNITY)
Admission: RE | Admit: 2017-07-07 | Discharge: 2017-07-10 | DRG: 470 | Disposition: A | Discharge status: Home Health | Payer: 59 | Source: Ambulatory Visit | Attending: Specialist | Admitting: Specialist

## 2017-07-07 DIAGNOSIS — R066 Hiccough: Secondary | ICD-10-CM | POA: Diagnosis present

## 2017-07-07 DIAGNOSIS — M48061 Spinal stenosis, lumbar region without neurogenic claudication: Secondary | ICD-10-CM | POA: Diagnosis not present

## 2017-07-07 DIAGNOSIS — E039 Hypothyroidism, unspecified: Secondary | ICD-10-CM | POA: Diagnosis present

## 2017-07-07 DIAGNOSIS — G8918 Other acute postprocedural pain: Secondary | ICD-10-CM | POA: Diagnosis not present

## 2017-07-07 DIAGNOSIS — M2391 Unspecified internal derangement of right knee: Secondary | ICD-10-CM | POA: Diagnosis present

## 2017-07-07 DIAGNOSIS — M1711 Unilateral primary osteoarthritis, right knee: Secondary | ICD-10-CM

## 2017-07-07 DIAGNOSIS — Z471 Aftercare following joint replacement surgery: Secondary | ICD-10-CM | POA: Diagnosis not present

## 2017-07-07 DIAGNOSIS — Z96659 Presence of unspecified artificial knee joint: Secondary | ICD-10-CM

## 2017-07-07 DIAGNOSIS — Z87891 Personal history of nicotine dependence: Secondary | ICD-10-CM | POA: Diagnosis not present

## 2017-07-07 DIAGNOSIS — M5136 Other intervertebral disc degeneration, lumbar region: Secondary | ICD-10-CM | POA: Diagnosis present

## 2017-07-07 DIAGNOSIS — Z96651 Presence of right artificial knee joint: Secondary | ICD-10-CM | POA: Diagnosis not present

## 2017-07-07 HISTORY — PX: TOTAL KNEE ARTHROPLASTY: SHX125

## 2017-07-07 LAB — POTASSIUM: Potassium: 4.6 mmol/L (ref 3.5–5.1)

## 2017-07-07 SURGERY — ARTHROPLASTY, KNEE, TOTAL
Anesthesia: Spinal | Site: Knee | Laterality: Right

## 2017-07-07 MED ORDER — ALUM & MAG HYDROXIDE-SIMETH 200-200-20 MG/5ML PO SUSP
30.0000 mL | ORAL | Status: DC | PRN
  Administered 2017-07-09: 30 mL via ORAL
  Filled 2017-07-07: qty 30

## 2017-07-07 MED ORDER — RISAQUAD PO CAPS
1.0000 | ORAL_CAPSULE | Freq: Every day | ORAL | Status: DC
  Administered 2017-07-08 – 2017-07-10 (×3): 1 via ORAL
  Filled 2017-07-07 (×3): qty 1

## 2017-07-07 MED ORDER — POLYETHYLENE GLYCOL 3350 17 G PO PACK: 17.0000 g | PACK | Freq: Every day | ORAL | Status: DC | PRN

## 2017-07-07 MED ORDER — FENTANYL CITRATE (PF) 100 MCG/2ML IJ SOLN
INTRAMUSCULAR | Status: AC
  Administered 2017-07-07: 50 ug via INTRAVENOUS
  Filled 2017-07-07: qty 2

## 2017-07-07 MED ORDER — CEFAZOLIN SODIUM-DEXTROSE 2-4 GM/100ML-% IV SOLN
2.0000 g | INTRAVENOUS | Status: AC
  Administered 2017-07-07: 2 g via INTRAVENOUS
  Filled 2017-07-07: qty 100

## 2017-07-07 MED ORDER — PHENOL 1.4 % MT LIQD: 1.0000 | OROMUCOSAL | Status: DC | PRN

## 2017-07-07 MED ORDER — LACTATED RINGERS IV SOLN
INTRAVENOUS | Status: DC
  Administered 2017-07-07 (×2): via INTRAVENOUS

## 2017-07-07 MED ORDER — METOCLOPRAMIDE HCL 5 MG PO TABS: 5.0000 mg | ORAL_TABLET | Freq: Three times a day (TID) | ORAL | Status: DC | PRN

## 2017-07-07 MED ORDER — HYDROMORPHONE HCL-NACL 0.5-0.9 MG/ML-% IV SOSY
0.5000 mg | PREFILLED_SYRINGE | INTRAVENOUS | Status: DC | PRN
  Administered 2017-07-07 (×2): 0.5 mg via INTRAVENOUS
  Filled 2017-07-07 (×2): qty 1

## 2017-07-07 MED ORDER — MENTHOL 3 MG MT LOZG: 1.0000 | LOZENGE | OROMUCOSAL | Status: DC | PRN

## 2017-07-07 MED ORDER — MAGNESIUM CITRATE PO SOLN: 1.0000 | Freq: Once | ORAL | Status: DC | PRN

## 2017-07-07 MED ORDER — VANCOMYCIN HCL IN DEXTROSE 1-5 GM/200ML-% IV SOLN
1000.0000 mg | INTRAVENOUS | Status: AC
Start: 2017-07-07 — End: 2017-07-07
  Administered 2017-07-07: 1000 mg via INTRAVENOUS
  Filled 2017-07-07: qty 200

## 2017-07-07 MED ORDER — KCL IN DEXTROSE-NACL 20-5-0.45 MEQ/L-%-% IV SOLN
INTRAVENOUS | Status: AC
  Administered 2017-07-07: 15:00:00 via INTRAVENOUS
  Filled 2017-07-07 (×2): qty 1000

## 2017-07-07 MED ORDER — ROPIVACAINE HCL 7.5 MG/ML IJ SOLN
INTRAMUSCULAR | Status: DC | PRN
  Administered 2017-07-07: 20 mL via PERINEURAL

## 2017-07-07 MED ORDER — OXYCODONE HCL 5 MG PO TABS
5.0000 mg | ORAL_TABLET | ORAL | Status: DC | PRN
  Administered 2017-07-07 (×2): 10 mg via ORAL
  Filled 2017-07-07 (×2): qty 2

## 2017-07-07 MED ORDER — BUPIVACAINE-EPINEPHRINE 0.25% -1:200000 IJ SOLN
INTRAMUSCULAR | Status: DC | PRN
  Administered 2017-07-07: 50 mL

## 2017-07-07 MED ORDER — METHOCARBAMOL 1000 MG/10ML IJ SOLN
500.0000 mg | Freq: Four times a day (QID) | INTRAVENOUS | Status: DC | PRN
  Administered 2017-07-07: 500 mg via INTRAVENOUS
  Filled 2017-07-07: qty 550

## 2017-07-07 MED ORDER — MIDAZOLAM HCL 2 MG/2ML IJ SOLN
2.0000 mg | Freq: Once | INTRAMUSCULAR | Status: AC
  Administered 2017-07-07: 1 mg via INTRAVENOUS

## 2017-07-07 MED ORDER — HYDROMORPHONE HCL-NACL 0.5-0.9 MG/ML-% IV SOSY: 0.2500 mg | PREFILLED_SYRINGE | INTRAVENOUS | Status: DC | PRN

## 2017-07-07 MED ORDER — VANCOMYCIN HCL IN DEXTROSE 1-5 GM/200ML-% IV SOLN
1000.0000 mg | Freq: Two times a day (BID) | INTRAVENOUS | Status: AC
  Administered 2017-07-07: 1000 mg via INTRAVENOUS
  Filled 2017-07-07: qty 200

## 2017-07-07 MED ORDER — ONDANSETRON HCL 4 MG/2ML IJ SOLN: 4.0000 mg | Freq: Four times a day (QID) | INTRAMUSCULAR | Status: DC | PRN

## 2017-07-07 MED ORDER — METHOCARBAMOL 500 MG PO TABS
500.0000 mg | ORAL_TABLET | Freq: Four times a day (QID) | ORAL | Status: DC | PRN
  Administered 2017-07-07 – 2017-07-10 (×7): 500 mg via ORAL
  Filled 2017-07-07 (×7): qty 1

## 2017-07-07 MED ORDER — BISACODYL 5 MG PO TBEC: 5.0000 mg | DELAYED_RELEASE_TABLET | Freq: Every day | ORAL | Status: DC | PRN

## 2017-07-07 MED ORDER — ASPIRIN EC 325 MG PO TBEC
325.0000 mg | DELAYED_RELEASE_TABLET | Freq: Every day | ORAL | Status: DC
  Administered 2017-07-08 – 2017-07-10 (×3): 325 mg via ORAL
  Filled 2017-07-07 (×3): qty 1

## 2017-07-07 MED ORDER — SODIUM CHLORIDE 0.9 % IV SOLN
INTRAVENOUS | Status: DC | PRN
  Administered 2017-07-07: 500 mL

## 2017-07-07 MED ORDER — DOCUSATE SODIUM 100 MG PO CAPS: 100.0000 mg | ORAL_CAPSULE | Freq: Two times a day (BID) | ORAL | 1 refills | Status: AC | PRN

## 2017-07-07 MED ORDER — SODIUM CHLORIDE 0.9 % IR SOLN
Status: DC | PRN
  Administered 2017-07-07: 1000 mL

## 2017-07-07 MED ORDER — HYDROMORPHONE HCL-NACL 0.5-0.9 MG/ML-% IV SOSY
0.5000 mg | PREFILLED_SYRINGE | INTRAVENOUS | Status: DC | PRN
  Administered 2017-07-07 – 2017-07-08 (×3): 1 mg via INTRAVENOUS
  Filled 2017-07-07 (×3): qty 2

## 2017-07-07 MED ORDER — ONDANSETRON HCL 4 MG PO TABS: 4.0000 mg | ORAL_TABLET | Freq: Four times a day (QID) | ORAL | Status: DC | PRN

## 2017-07-07 MED ORDER — DIPHENHYDRAMINE HCL 12.5 MG/5ML PO ELIX: 12.5000 mg | ORAL_SOLUTION | ORAL | Status: DC | PRN

## 2017-07-07 MED ORDER — FENTANYL CITRATE (PF) 100 MCG/2ML IJ SOLN
100.0000 ug | Freq: Once | INTRAMUSCULAR | Status: AC
  Administered 2017-07-07: 50 ug via INTRAVENOUS

## 2017-07-07 MED ORDER — ACETAMINOPHEN 325 MG PO TABS: 650.0000 mg | ORAL_TABLET | Freq: Four times a day (QID) | ORAL | Status: DC | PRN

## 2017-07-07 MED ORDER — ACETAMINOPHEN 650 MG RE SUPP: 650.0000 mg | Freq: Four times a day (QID) | RECTAL | Status: DC | PRN

## 2017-07-07 MED ORDER — DOCUSATE SODIUM 100 MG PO CAPS
100.0000 mg | ORAL_CAPSULE | Freq: Two times a day (BID) | ORAL | Status: DC
  Administered 2017-07-07 – 2017-07-10 (×6): 100 mg via ORAL
  Filled 2017-07-07 (×6): qty 1

## 2017-07-07 MED ORDER — LEVOTHYROXINE SODIUM 50 MCG PO TABS
50.0000 ug | ORAL_TABLET | Freq: Every day | ORAL | Status: DC
  Administered 2017-07-08 – 2017-07-10 (×3): 50 ug via ORAL
  Filled 2017-07-07 (×3): qty 1

## 2017-07-07 MED ORDER — ASPIRIN EC 325 MG PO TBEC: 325.0000 mg | DELAYED_RELEASE_TABLET | Freq: Two times a day (BID) | ORAL | 1 refills | Status: AC

## 2017-07-07 MED ORDER — TRANEXAMIC ACID 1000 MG/10ML IV SOLN
1000.0000 mg | INTRAVENOUS | Status: AC
  Administered 2017-07-07: 1000 mg via INTRAVENOUS
  Filled 2017-07-07: qty 1100

## 2017-07-07 MED ORDER — SODIUM CHLORIDE 0.9 % IV SOLN
INTRAVENOUS | Status: AC
  Filled 2017-07-07: qty 500000

## 2017-07-07 MED ORDER — PROMETHAZINE HCL 25 MG/ML IJ SOLN: 6.2500 mg | INTRAMUSCULAR | Status: DC | PRN

## 2017-07-07 MED ORDER — BUPIVACAINE-EPINEPHRINE 0.25% -1:200000 IJ SOLN
INTRAMUSCULAR | Status: AC
  Filled 2017-07-07: qty 1

## 2017-07-07 MED ORDER — ACETAMINOPHEN 10 MG/ML IV SOLN
1000.0000 mg | INTRAVENOUS | Status: AC
  Administered 2017-07-07: 1000 mg via INTRAVENOUS
  Filled 2017-07-07: qty 100

## 2017-07-07 MED ORDER — METOCLOPRAMIDE HCL 5 MG/ML IJ SOLN
5.0000 mg | Freq: Three times a day (TID) | INTRAMUSCULAR | Status: DC | PRN
  Administered 2017-07-09: 10 mg via INTRAVENOUS
  Filled 2017-07-07: qty 2

## 2017-07-07 MED ORDER — FENTANYL CITRATE (PF) 100 MCG/2ML IJ SOLN
INTRAMUSCULAR | Status: AC
  Filled 2017-07-07: qty 2

## 2017-07-07 MED ORDER — PROPOFOL 500 MG/50ML IV EMUL
INTRAVENOUS | Status: DC | PRN
  Administered 2017-07-07: 75 ug/kg/min via INTRAVENOUS

## 2017-07-07 MED ORDER — BUPIVACAINE HCL (PF) 0.75 % IJ SOLN
INTRAMUSCULAR | Status: DC | PRN
  Administered 2017-07-07: 1.6 mL via INTRATHECAL

## 2017-07-07 MED ORDER — OXYCODONE HCL 5 MG PO TABS
10.0000 mg | ORAL_TABLET | ORAL | Status: DC | PRN
  Administered 2017-07-07 – 2017-07-09 (×8): 15 mg via ORAL
  Administered 2017-07-09 (×3): 10 mg via ORAL
  Administered 2017-07-09: 05:00:00 15 mg via ORAL
  Administered 2017-07-10: 10 mg via ORAL
  Filled 2017-07-07: qty 2
  Filled 2017-07-07 (×2): qty 3
  Filled 2017-07-07: qty 2
  Filled 2017-07-07 (×5): qty 3
  Filled 2017-07-07: qty 2
  Filled 2017-07-07: qty 3
  Filled 2017-07-07: qty 2
  Filled 2017-07-07: qty 3

## 2017-07-07 MED ORDER — EPHEDRINE SULFATE 50 MG/ML IJ SOLN
INTRAMUSCULAR | Status: DC | PRN
  Administered 2017-07-07 (×2): 10 mg via INTRAVENOUS
  Administered 2017-07-07: 5 mg via INTRAVENOUS

## 2017-07-07 MED ORDER — FENTANYL CITRATE (PF) 100 MCG/2ML IJ SOLN
INTRAMUSCULAR | Status: DC | PRN
  Administered 2017-07-07: 100 ug via INTRAVENOUS

## 2017-07-07 MED ORDER — OXYCODONE-ACETAMINOPHEN 5-325 MG PO TABS: 1.0000 | ORAL_TABLET | ORAL | 0 refills | Status: AC | PRN

## 2017-07-07 MED ORDER — MIDAZOLAM HCL 2 MG/2ML IJ SOLN
INTRAMUSCULAR | Status: AC
  Administered 2017-07-07: 1 mg via INTRAVENOUS
  Filled 2017-07-07: qty 2

## 2017-07-07 MED ORDER — POLYETHYLENE GLYCOL 3350 17 G PO PACK: 17.0000 g | PACK | Freq: Every day | ORAL | 0 refills | Status: AC

## 2017-07-07 SURGICAL SUPPLY — 62 items
AGENT HMST SPONGE THK3/8 (HEMOSTASIS) ×1
BAG SPEC THK2 15X12 ZIP CLS (MISCELLANEOUS)
BAG ZIPLOCK 12X15 (MISCELLANEOUS) IMPLANT
BANDAGE ACE 4X5 VEL STRL LF (GAUZE/BANDAGES/DRESSINGS) ×3 IMPLANT
BANDAGE ACE 6X5 VEL STRL LF (GAUZE/BANDAGES/DRESSINGS) ×3 IMPLANT
BLADE SAG 18X100X1.27 (BLADE) ×3 IMPLANT
BLADE SAW SGTL 11.0X1.19X90.0M (BLADE) ×6 IMPLANT
BLADE SAW SGTL 13.0X1.19X90.0M (BLADE) ×3 IMPLANT
CAPT KNEE TOTAL 3 ATTUNE ×3 IMPLANT
CEMENT HV SMART SET (Cement) ×6 IMPLANT
CLOSURE WOUND 1/2 X4 (GAUZE/BANDAGES/DRESSINGS) ×1
CLOTH 2% CHLOROHEXIDINE 3PK (PERSONAL CARE ITEMS) ×3 IMPLANT
COVER SURGICAL LIGHT HANDLE (MISCELLANEOUS) ×3 IMPLANT
CUFF TOURN SGL QUICK 34 (TOURNIQUET CUFF) ×3
CUFF TRNQT CYL 34X4X40X1 (TOURNIQUET CUFF) ×1 IMPLANT
DECANTER SPIKE VIAL GLASS SM (MISCELLANEOUS) ×3 IMPLANT
DRAPE INCISE IOBAN 66X45 STRL (DRAPES) ×3 IMPLANT
DRAPE ORTHO SPLIT 77X108 STRL (DRAPES) ×4
DRAPE SHEET LG 3/4 BI-LAMINATE (DRAPES) ×3 IMPLANT
DRAPE SURG ORHT 6 SPLT 77X108 (DRAPES) ×2 IMPLANT
DRAPE U-SHAPE 47X51 STRL (DRAPES) ×3 IMPLANT
DRSG AQUACEL AG ADV 3.5X10 (GAUZE/BANDAGES/DRESSINGS) ×3 IMPLANT
DRSG TEGADERM 4X4.75 (GAUZE/BANDAGES/DRESSINGS) ×3 IMPLANT
DURAPREP 26ML APPLICATOR (WOUND CARE) ×3 IMPLANT
ELECT REM PT RETURN 15FT ADLT (MISCELLANEOUS) ×3 IMPLANT
EVACUATOR 1/8 PVC DRAIN (DRAIN) IMPLANT
GAUZE SPONGE 2X2 8PLY STRL LF (GAUZE/BANDAGES/DRESSINGS) IMPLANT
GLOVE BIOGEL PI IND STRL 7.0 (GLOVE) ×1 IMPLANT
GLOVE BIOGEL PI IND STRL 8 (GLOVE) ×1 IMPLANT
GLOVE BIOGEL PI INDICATOR 7.0 (GLOVE) ×2
GLOVE BIOGEL PI INDICATOR 8 (GLOVE) ×2
GLOVE SURG SS PI 7.0 STRL IVOR (GLOVE) ×3 IMPLANT
GLOVE SURG SS PI 7.5 STRL IVOR (GLOVE) ×3 IMPLANT
GLOVE SURG SS PI 8.0 STRL IVOR (GLOVE) ×6 IMPLANT
GOWN STRL REUS W/TWL XL LVL3 (GOWN DISPOSABLE) ×9 IMPLANT
HANDPIECE INTERPULSE COAX TIP (DISPOSABLE) ×3
HEMOSTAT SPONGE AVITENE ULTRA (HEMOSTASIS) ×3 IMPLANT
IMMOBILIZER KNEE 20 (SOFTGOODS) ×3
IMMOBILIZER KNEE 20 THIGH 36 (SOFTGOODS) ×1 IMPLANT
MANIFOLD NEPTUNE II (INSTRUMENTS) ×3 IMPLANT
NS IRRIG 1000ML POUR BTL (IV SOLUTION) IMPLANT
PACK TOTAL KNEE CUSTOM (KITS) ×3 IMPLANT
POSITIONER SURGICAL ARM (MISCELLANEOUS) ×3 IMPLANT
SET HNDPC FAN SPRY TIP SCT (DISPOSABLE) ×1 IMPLANT
SPONGE GAUZE 2X2 STER 10/PKG (GAUZE/BANDAGES/DRESSINGS)
SPONGE SURGIFOAM ABS GEL 100 (HEMOSTASIS) IMPLANT
STAPLER VISISTAT (STAPLE) IMPLANT
STRIP CLOSURE SKIN 1/2X4 (GAUZE/BANDAGES/DRESSINGS) ×2 IMPLANT
SUT BONE WAX W31G (SUTURE) IMPLANT
SUT MNCRL AB 4-0 PS2 18 (SUTURE) IMPLANT
SUT STRATAFIX 0 PDS 27 VIOLET (SUTURE) ×3
SUT VIC AB 1 CT1 27 (SUTURE) ×4
SUT VIC AB 1 CT1 27XBRD ANTBC (SUTURE) ×2 IMPLANT
SUT VIC AB 2-0 CT1 27 (SUTURE) ×6
SUT VIC AB 2-0 CT1 TAPERPNT 27 (SUTURE) ×3 IMPLANT
SUTURE STRATFX 0 PDS 27 VIOLET (SUTURE) ×1 IMPLANT
SYR 50ML LL SCALE MARK (SYRINGE) ×3 IMPLANT
TOWER CARTRIDGE SMART MIX (DISPOSABLE) ×3 IMPLANT
TRAY FOLEY W/METER SILVER 16FR (SET/KITS/TRAYS/PACK) ×3 IMPLANT
WATER STERILE IRR 1500ML POUR (IV SOLUTION) ×6 IMPLANT
WRAP KNEE MAXI GEL POST OP (GAUZE/BANDAGES/DRESSINGS) ×3 IMPLANT
YANKAUER SUCT BULB TIP 10FT TU (MISCELLANEOUS) ×3 IMPLANT

## 2017-07-07 NOTE — Progress Notes (Signed)
AssistedDr. Massagee with right, ultrasound guided, adductor canal block. Side rails up, monitors on throughout procedure. See vital signs in flow sheet. Tolerated Procedure well.  

## 2017-07-07 NOTE — Interval H&P Note (Signed)
History and Physical Interval Note:  07/07/2017 9:44 AM  Dale Miller  has presented today for surgery, with the diagnosis of Right knee degenerative joint disease  The various methods of treatment have been discussed with the patient and family. After consideration of risks, benefits and other options for treatment, the patient has consented to  Procedure(s): TOTAL RIGHT KNEE ARTHROPLASTY (Right) as a surgical intervention .  The patient's history has been reviewed, patient examined, no change in status, stable for surgery.  I have reviewed the patient's chart and labs.  Questions were answered to the patient's satisfaction.     [name] C

## 2017-07-07 NOTE — H&P (View-Only) (Signed)
Dale Miller DOB: 09/15/1946 Married / Language: English / Race: White Male  H&P Date: 06/30/17  Chief Complaint: Right knee pain  History of Present Illness The patient is a 71 year old male who comes in today for a preoperative History and Physical. The patient is scheduled for a right total knee arthroplasty to be performed by Dr. [name] C. Beane, MD at [facility] on 07/07/17 .   Dale Miller reports ongoing, progressively worsening right knee pain refractory to conservative treatment including arthroscopy, steroid injections, viscous supplementation, bracing, quad strengthening, home exercise program, medications, relative rest and activity modification. Pain is interfering with his quality-of-life and activities of daily living at this point and he has elected to proceed with total knee replacement.  Dr. Beane and the patient mutually agreed to proceed with a total knee replacement. Risks and benefits of the procedure were discussed including stiffness, suboptimal range of motion, persistent pain, infection requiring removal of prosthesis and reinsertion, need for prophylactic antibiotics in the future, for example, dental procedures, possible need for manipulation, revision in the future and also anesthetic complications including DVT, PE, etc. We discussed the perioperative course, time in the hospital, postoperative recovery and the need for elevation to control swelling. We also discussed the predicted range of motion and the probability that squatting and kneeling would be unobtainable in the future. In addition, postoperative anticoagulation was discussed. We have obtained preoperative medical clearance as necessary. Provided illustrated handout and discussed it in detail. They will enroll in the total joint replacement educational forum at the hospital.  He had his preop appointment at the hospital this morning.  Problem List/Past Medical Hx Chondromalacia, patella, right  (M22.41)  Knee internal derangement, right (M23.91)  S/P Arthroscopic Partial Medial Menisectomy (Z98.890)  Acute pain of left knee (M25.562)  Encounter for care following Knee Arthroscopy (Z47.89)  Degeneration of meniscus of knee, right (M23.306)  Lumbar spine pain (M54.5)  Primary osteoarthritis of right knee (M17.11)  Spinal stenosis, lumbar (M48.061)  Derangement of posterior horn of medial meniscus of right knee (M23.321)  Lumbar DDD (M51.36)   Allergies No Known Drug Allergies [08/10/2014]:  Family History  First Degree Relatives  Cancer  Mother. Cerebrovascular Accident  Maternal Grandmother.  Social History  Tobacco use  Never smoker. 08/10/2014  Medication History Vitamin B12 (Oral) Specific strength unknown - Active. Prevagen (10MG Capsule, Oral) Active. Multi Vitamin Daily (Oral) Active. Glucosamine Chondroit-Collagen (Oral) Active. Fish Oil (Oral) Specific strength unknown - Inactive. Levothyroxine Sodium (Oral) Specific strength unknown - Active. Medications Reconciled  Past Surgical History  Appendectomy  Tonsillectomy  Cataract Surgery  right  Physical Exam General Mental Status -Alert, cooperative and good historian. General Appearance-pleasant, Not in acute distress. Orientation-Oriented X3. Build & Nutrition-Well nourished and Well developed.  Head and Neck Head-normocephalic, atraumatic . Neck Global Assessment - supple, no bruit auscultated on the right, no bruit auscultated on the left.  Eye Pupil - Bilateral-Regular and Round. Motion - Bilateral-EOMI.  Chest and Lung Exam Auscultation Breath sounds - clear at anterior chest wall and clear at posterior chest wall. Adventitious sounds - No Adventitious sounds.  Cardiovascular Auscultation Rhythm - Regular rate and rhythm. Heart Sounds - S1 WNL and S2 WNL. Murmurs & Other Heart Sounds - Auscultation of the heart reveals - No  Murmurs.  Abdomen Palpation/Percussion Tenderness - Abdomen is non-tender to palpation. Rigidity (guarding) - Abdomen is soft. Auscultation Auscultation of the abdomen reveals - Bowel sounds normal.  Male Genitourinary Not done, not pertinent to   present illness  Musculoskeletal Antalgic gait with a varus thrust right knee. No assistive devices.  Right knee is tender to palpation medial joint line. Nontender lateral joint line, patella, patellar tendon, quad tendon, peroneal nerve or popliteal space. No calf pain or sign of DVT. No pain or laxity with varus or valgus stress. No instability noted on exam today. Mild patellofemoral pain and patellofemoral crepitus. No erythema, ecchymosis, increased warmth or any sign of infection. Trace effusion. Range of motion approximately 0-115.  Imaging xrays from 01/2017 again reviewed today by Dr. Beane. No fx, subluxation, dislocation, lytic or blastic lesions. Right knee with moderately severe medial joint space narrowing with kissing lesion at the medial joint space. Prior MRI reviewed (09/2015) with extensive cartilage loss medial femoral condyle.  Assessment & Plan Primary osteoarthritis of right knee (M17.11)  Pt with end-stage right knee DJD, bone-on-bone, refractory to conservative tx, scheduled for right total knee replacement by Dr. Beane 07/07/17. We again discussed the procedure itself as well as risks, complications and alternatives, including but not limited to DVT, PE, infx, bleeding, failure of procedure, need for secondary procedure including manipulation, nerve injury, ongoing pain/symptoms, anesthesia risk, even stroke or death. Also discussed typical post-op protocols, activity restrictions, need for PT, flexion/extension exercises, time out of work. Discussed need for DVT ppx post-op per protocol. Discussed dental ppx and infx prevention. Also discussed limitations post-operatively such as kneeling and squatting. All questions  were answered. Patient desires to proceed with surgery as scheduled. Will hold supplements, ASA and NSAIDs accordingly. Will remain NPO after MN night before surgery. Will present to [facility] for pre-op testing. Anticipate hospital stay to include at least 2 midnights given medical history and to ensure proper pain control. Plan ASA 325mg BID for DVT ppx post-op. Plan pain medication, Robaxin, Colace, Miralax. Plan home with HHPT post-op with family members at home for assistance. Will follow up 10-14 days post-op for staple removal and xrays.  Plan right total knee replacement  Signed electronically by Nadir Vasques M Mionna Advincula, PA-C for Dr. Beane 

## 2017-07-07 NOTE — Anesthesia Procedure Notes (Signed)
Spinal  Start time: 07/07/2017 10:38 AM End time: 07/07/2017 10:42 AM Staffing Resident/CRNA: Kizzie Fantasia Performed: resident/CRNA  Preanesthetic Checklist Completed: patient identified, site marked, surgical consent, pre-op evaluation, timeout performed, IV checked, risks and benefits discussed and monitors and equipment checked Spinal Block Patient position: sitting Prep: Betadine Patient monitoring: heart rate, continuous pulse ox and blood pressure Approach: midline Location: L4-5 Injection technique: single-shot Needle Needle type: Quincke  Needle gauge: 22 G Needle length: 9 cm Needle insertion depth: 7 cm Additional Notes Pt sitting position sterile prep and drape, negative paresthesia, negative heme.

## 2017-07-07 NOTE — Transfer of Care (Signed)
Immediate Anesthesia Transfer of Care Note  Patient: [facility]  Procedure(s) Performed: Procedure(s): TOTAL RIGHT KNEE ARTHROPLASTY (Right)  Patient Location: PACU  Anesthesia Type:Spinal  Level of Consciousness: awake, alert  and oriented  Airway & Oxygen Therapy: Patient Spontanous Breathing and Patient connected to face mask oxygen  Post-op Assessment: Report given to RN and Post -op Vital signs reviewed and stable  Post vital signs: Reviewed and stable  Last Vitals:  Vitals:   07/07/17 1010 07/07/17 1011  BP:    Pulse: 66 66  Resp: 16 16  Temp:    SpO2: 99% 99%    Last Pain:  Vitals:   07/07/17 0814  TempSrc: Oral         Complications: No apparent anesthesia complications

## 2017-07-07 NOTE — Evaluation (Signed)
Physical Therapy Evaluation Patient Details Name: Dale Miller MRN: [id number] DOB: May 13, 1946 Today's Date: 07/07/2017   History of Present Illness  Pt s/p R TKR   Clinical Impression  Pt s/p R TKR and presents with decreased R LE strength/ROM and post op pain limiting functional mobility.  Pt should progress to dc home with family assist.    Follow Up Recommendations DC plan and follow up therapy as arranged by surgeon    Equipment Recommendations  None recommended by PT    Recommendations for Other Services OT consult     Precautions / Restrictions Precautions Precautions: Knee;Fall Required Braces or Orthoses: Knee Immobilizer - Right Knee Immobilizer - Right: Discontinue once straight leg raise with < 10 degree lag Restrictions Weight Bearing Restrictions: No Other Position/Activity Restrictions: WBAT      Mobility  Bed Mobility Overal bed mobility: Needs Assistance Bed Mobility: Supine to Sit     Supine to sit: Min assist;Mod assist     General bed mobility comments: cues for sequence and use of L LE to self assist  Transfers Overall transfer level: Needs assistance   Transfers: Sit to/from Stand;Stand Pivot Transfers Sit to Stand: Min assist;Mod assist Stand pivot transfers: Min assist;Mod assist       General transfer comment: cues for LE management and use of UEs to self assist  Ambulation/Gait Ambulation/Gait assistance: Min assist Ambulation Distance (Feet): 3 Feet Assistive device: Rolling walker (2 wheeled) Gait Pattern/deviations: Step-to pattern;Decreased step length - right;Decreased step length - left;Shuffle;Trunk flexed Gait velocity: decr   General Gait Details: cues for sequence, posture and position from RW.  Distance ltd by c/o pain and dizziness  Stairs            Wheelchair Mobility    Modified Rankin (Stroke Patients Only)       Balance                                             Pertinent  Vitals/Pain Pain Assessment: 0-10 Pain Score: 7  Pain Location: R knee Pain Descriptors / Indicators: Aching;Sore Pain Intervention(s): Premedicated before session;Monitored during session;Limited activity within patient's tolerance;Ice applied    Home Living Family/patient expects to be discharged to:: Private residence Living Arrangements: Spouse/significant other;Children Available Help at Discharge: Family Type of Home: House Home Access: Stairs to enter Entrance Stairs-Rails: Right Entrance Stairs-Number of Steps: 5 Home Layout: Two level Home Equipment:  (Pt checking on RW at home) Additional Comments: Pt reports home with spouse and younger son.  Younger son is caregiver for pt's wife    Prior Function Level of Independence: Independent               Hand Dominance        Extremity/Trunk Assessment   Upper Extremity Assessment Upper Extremity Assessment: Overall WFL for tasks assessed    Lower Extremity Assessment Lower Extremity Assessment: RLE deficits/detail    Cervical / Trunk Assessment Cervical / Trunk Assessment: Normal  Communication   Communication: No difficulties  Cognition Arousal/Alertness: Awake/alert Behavior During Therapy: WFL for tasks assessed/performed Overall Cognitive Status: Within Functional Limits for tasks assessed                                        General Comments  Exercises Total Joint Exercises Ankle Circles/Pumps: AROM;Both;15 reps;Supine   Assessment/Plan    PT Assessment Patient needs continued PT services  PT Problem List Decreased strength;Decreased range of motion;Decreased activity tolerance;Decreased mobility;Decreased knowledge of use of DME;Pain       PT Treatment Interventions DME instruction;Gait training;Stair training;Functional mobility training;Therapeutic activities;Therapeutic exercise;Patient/family education    PT Goals (Current goals can be found in the Care Plan  section)  Acute Rehab PT Goals Patient Stated Goal: Less pain PT Goal Formulation: With patient Time For Goal Achievement: 07/11/17 Potential to Achieve Goals: Good    Frequency 7X/week   Barriers to discharge        Co-evaluation               AM-PAC PT "6 Clicks" Daily Activity  Outcome Measure Difficulty turning over in bed (including adjusting bedclothes, sheets and blankets)?: Unable Difficulty moving from lying on back to sitting on the side of the bed? : Unable Difficulty sitting down on and standing up from a chair with arms (e.g., wheelchair, bedside commode, etc,.)?: Unable Help needed moving to and from a bed to chair (including a wheelchair)?: A Lot Help needed walking in hospital room?: A Lot Help needed climbing 3-5 steps with a railing? : A Lot 6 Click Score: 9    End of Session Equipment Utilized During Treatment: Gait belt;Right knee immobilizer Activity Tolerance: Patient tolerated treatment well Patient left: in chair;with call bell/phone within reach Nurse Communication: Mobility status PT Visit Diagnosis: Difficulty in walking, not elsewhere classified (R26.2);Pain Pain - Right/Left: Right Pain - part of body: Knee    Time: 1610-9604 PT Time Calculation (min) (ACUTE ONLY): 22 min   Charges:   PT Evaluation $PT Eval Low Complexity: 1 Low     PT G Codes:        Pg [phone]   Erminia Mcnew 07/07/2017, 5:11 PM

## 2017-07-07 NOTE — Discharge Instructions (Signed)

## 2017-07-07 NOTE — Progress Notes (Signed)
Contacted office for on call physician, patient stating IV medication and PO oxycodone for have provided him with no pain relief. Waiting for new orders or call back.

## 2017-07-07 NOTE — Anesthesia Postprocedure Evaluation (Signed)
Anesthesia Post Note  Patient: Dale Miller  Procedure(s) Performed: Procedure(s) (LRB): TOTAL RIGHT KNEE ARTHROPLASTY (Right)     Patient location during evaluation: PACU Anesthesia Type: Spinal Level of consciousness: oriented and awake and alert Pain management: pain level controlled Vital Signs Assessment: post-procedure vital signs reviewed and stable Respiratory status: spontaneous breathing, respiratory function stable and nonlabored ventilation Cardiovascular status: blood pressure returned to baseline and stable Postop Assessment: no headache, no backache, spinal receding, patient able to bend at knees and no signs of nausea or vomiting Anesthetic complications: no    Last Vitals:  Vitals:   07/07/17 1315 07/07/17 1330  BP: (!) 151/67 (!) 151/70  Pulse: 76 80  Resp: 16 13  Temp:  (!) 36 C  SpO2: 100% 100%    Last Pain:  Vitals:   07/07/17 0814  TempSrc: Oral                 Olamide Carattini A.

## 2017-07-07 NOTE — Brief Op Note (Signed)
07/07/2017  12:21 PM  PATIENT:  Dale Miller  71 y.o. male  PRE-OPERATIVE DIAGNOSIS:  Right knee degenerative joint disease  POST-OPERATIVE DIAGNOSIS:  Right knee degenerative joint disease  PROCEDURE:  Procedure(s): TOTAL RIGHT KNEE ARTHROPLASTY (Right)  SURGEON:  Surgeon(s) and Role:    Jene Every, MD - Primary  PHYSICIAN ASSISTANT:   ASSISTANTS: Bissell   ANESTHESIA:   general  EBL:  Total I/O In: 1000 [I.V.:1000] Out: 25 [Blood:25]  BLOOD ADMINISTERED:none  DRAINS: none   LOCAL MEDICATIONS USED:  MARCAINE     SPECIMEN:  No Specimen  DISPOSITION OF SPECIMEN:  N/A  COUNTS:  YES  TOURNIQUET:   Total Tourniquet Time Documented: Thigh (Right) - 58 minutes Total: Thigh (Right) - 58 minutes   DICTATION: .Other Dictation: Dictation Number [id number]  PLAN OF CARE: Admit to inpatient   PATIENT DISPOSITION:  Good   Delay start of Pharmacological VTE agent (>24hrs) due to surgical blood loss or risk of bleeding: no

## 2017-07-07 NOTE — Anesthesia Procedure Notes (Addendum)
Anesthesia Regional Block: Adductor canal block   Pre-Anesthetic Checklist: ,, timeout performed, Correct Patient, Correct Site, Correct Laterality, Correct Procedure, Correct Position, site marked, Risks and benefits discussed,  Surgical consent,  Pre-op evaluation,  At surgeon's request and post-op pain management  Laterality: Right and Lower  Prep: chloraprep       Needles:   Needle Type: Echogenic Stimulator Needle     Needle Length: 9cm  Needle Gauge: 21   Needle insertion depth: 5 cm   Additional Needles:   Procedures: ultrasound guided,,,,,,,,  Narrative:  Start time: 07/07/2017 9:40 AM End time: 07/07/2017 9:55 AM Injection made incrementally with aspirations every 5 mL.  Performed by: Personally  Anesthesiologist: Adhrit Krenz

## 2017-07-07 NOTE — Progress Notes (Signed)
CSW consulted to assist with SNF placement. PN reviewed. PT is planning to return home at dc. RNCM will assist with dc planning.  Cori Razor LCSW [phone]

## 2017-07-07 NOTE — Anesthesia Preprocedure Evaluation (Addendum)
Anesthesia Evaluation  Patient identified by MRN, date of birth, ID band Patient awake    Reviewed: Allergy & Precautions, NPO status , Patient's Chart, lab work & pertinent test results  Airway Mallampati: II   Neck ROM: Full    Dental no notable dental hx.    Pulmonary former smoker,    breath sounds clear to auscultation       Cardiovascular  Rhythm:Regular Rate:Normal     Neuro/Psych    GI/Hepatic Neg liver ROS,   Endo/Other    Renal/GU negative Renal ROS     Musculoskeletal  (+) Arthritis ,   Abdominal   Peds  Hematology negative hematology ROS (+)   Anesthesia Other Findings   Reproductive/Obstetrics                             Anesthesia Physical Anesthesia Plan  ASA: II  Anesthesia Plan: Spinal   Post-op Pain Management:  Regional for Post-op pain   Induction: Intravenous  PONV Risk Score and Plan: 2 and Ondansetron and Dexamethasone  Airway Management Planned: Natural Airway and Simple Face Mask  Additional Equipment:   Intra-op Plan:   Post-operative Plan:   Informed Consent: I have reviewed the patients History and Physical, chart, labs and discussed the procedure including the risks, benefits and alternatives for the proposed anesthesia with the patient or authorized representative who has indicated his/her understanding and acceptance.     Plan Discussed with:   Anesthesia Plan Comments:         Anesthesia Quick Evaluation

## 2017-07-08 ENCOUNTER — Encounter (HOSPITAL_COMMUNITY): Payer: Self-pay | Admitting: Specialist

## 2017-07-08 LAB — CBC
HEMATOCRIT: 44.4 % (ref 39.0–52.0)
HEMOGLOBIN: 14.8 g/dL (ref 13.0–17.0)
MCH: 31 pg (ref 26.0–34.0)
MCHC: 33.3 g/dL (ref 30.0–36.0)
MCV: 93.1 fL (ref 78.0–100.0)
Platelets: 141 10*3/uL — ABNORMAL LOW (ref 150–400)
RBC: 4.77 MIL/uL (ref 4.22–5.81)
RDW: 12.6 % (ref 11.5–15.5)
WBC: 12 10*3/uL — ABNORMAL HIGH (ref 4.0–10.5)

## 2017-07-08 LAB — BASIC METABOLIC PANEL
Anion gap: 4 — ABNORMAL LOW (ref 5–15)
BUN: 14 mg/dL (ref 6–20)
CHLORIDE: 101 mmol/L (ref 101–111)
CO2: 28 mmol/L (ref 22–32)
CREATININE: 0.84 mg/dL (ref 0.61–1.24)
Calcium: 8.6 mg/dL — ABNORMAL LOW (ref 8.9–10.3)
GFR calc non Af Amer: >60 mL/min (ref >60)
Glucose, Bld: 155 mg/dL — ABNORMAL HIGH (ref 65–99)
Potassium: 4.2 mmol/L (ref 3.5–5.1)
Sodium: 133 mmol/L — ABNORMAL LOW (ref 135–145)

## 2017-07-08 MED ORDER — LORAZEPAM 0.5 MG PO TABS
0.5000 mg | ORAL_TABLET | Freq: Four times a day (QID) | ORAL | Status: DC | PRN
  Administered 2017-07-08 (×2): 0.5 mg via ORAL
  Filled 2017-07-08 (×2): qty 1

## 2017-07-08 NOTE — Progress Notes (Signed)
Dale Miller made aware of patients HTN. Ativan PO ordered for patients anxiety and tension to hopefully bring down BP.

## 2017-07-08 NOTE — Progress Notes (Signed)
PT Cancellation Note  Patient Details Name: Dale Miller MRN: [id number] DOB: 1946-06-21   Cancelled Treatment:     attempted to see twice in am 1. Working with OT 2. Waiting on pain meds (very emotional)   Felecia Shelling  PTA [facility]  Acute  Rehab Pager      [phone]

## 2017-07-08 NOTE — Op Note (Signed)
NAME:  Dale Miller, Dale Miller                   ACCOUNT NO.:  MEDICAL RECORD NO.:  [id number]  LOCATION:                                 FACILITY:  PHYSICIAN:  Jene Every, M.D.         DATE OF BIRTH:  DATE OF PROCEDURE:  07/07/2017 DATE OF DISCHARGE:                              OPERATIVE REPORT   PREOPERATIVE DIAGNOSIS:  End-stage osteoarthrosis, right knee.  POSTOPERATIVE DIAGNOSIS:  End-stage osteoarthrosis, right knee.  PROCEDURE PERFORMED:  Right total knee arthroplasty.  COMPONENTS:  DePuy Attune 5 femur, 5 tibia, 6 insert, 35 patella.  ANESTHESIA:  Spinal.  ASSISTANT:  Lanna Poche, PA.  HISTORY:  Bone-on-bone arthrosis, medial compartment, refractory to conservative treatment including rest, activity modification, arthroscopy, negative affect to his activities of daily living, indicated for replacement of degenerative joint.  Risks and benefits were discussed including bleeding, infection, damage to the neurovascular structures, no change in symptoms, worsening symptoms, DVT, PE, anesthetic complications, etc.  TECHNIQUE:  With the patient in supine position after induction of adequate spinal anesthesia, right lower extremity was prepped and draped, and exsanguinated in usual sterile fashion.  Thigh tourniquet inflated to 250 mmHg.  Midline incision was then made and full-thickness flaps developed.  Median parapatellar arthrotomy performed.  Soft tissues elevated medially preserving the MCL.  Patella everted and knee flexed.  Severe osteoarthrosis in the medial compartment and in the patellofemoral joint were noted.  Remnants of the medial and lateral menisci were removed as was the ACL.  Step drill was utilized to enter the femoral canal, was irrigated, 5-degree right was selected with 9 off the distal femur, it was pinned and distal femoral cut was performed.  I then sized it off the anterior cortex, which was 5, 3 degrees of external rotation.  It was sized  to a 5 pin.  We then used a block cut, putting guide, performed the anterior, posterior and chamfer cuts. Tibia was subluxed after that.  4 off the defect, which was medial, 10 off the lateral side.  Bisecting the tibiotalar joint parallel to the shaft.  3 degrees posterior slope pin.  We performed that cut, protecting the soft tissues posteriorly at all times.  Next, we sized the tibia to a 5, used our central drill and punch guide. Prior harvesting bone graft, which was placed in the femoral canal. This was after the box cut was then performed bisecting the canal. Trial femur and tibia had been placed.  We drilled our lug holes and placed a 5 and then a 6 insert and we had full extension, full flexion, good stability, varus and valgus stressing 0-30 degrees, negative anterior drawer.  Attention turned toward the patella, it was measured at a 23, planed to a 15 with an external patellar jig with an oscillating saw.  We drilled our PEG holes medializing them, placed a trial 35 and reduced it and had excellent patellofemoral tracking.  Once all the trials were removed, we copiously irrigated the joint with pulsatile lavage and mixed the cement on the back table in appropriate fashion.  Flexed the knee, all surfaces thoroughly dried.  Checked posteriorly.  No residual  meniscus noted, popliteus was intact.  I injected the cement in the proximal tibia, impacted a 5 tibial tray.  We digitally pressurized the cement.  We cemented and impacted the femur, redundant cement removed.  We cemented and clamped the patella and 6 insert was inserted, it was reduced, and an axial load was held throughout the curing of the cement.  Redundant cement removed. Marcaine with epinephrine was injected in the periosteum of the femur. Copiously irrigated.  After appropriate curing of the cement, knee was flexed.  All redundant cement removed meticulously with an osteotome.  I then copiously irrigated the wound,  pulsatile lavage and antibiotic irrigation, placed a 6 permanent insert.  When we reduced it, we had full extension, full flexion, good stability, varus and valgus stressing, negative anterior drawer, excellent patellofemoral tracking. We repaired the patellar arthrotomy with #1 Vicryl interrupted figure-of- eight sutures oversewn with a running Stratafix.  Subcu with 2-0 and skin with staples.  Wound was dressed sterilely and flexion to gravity prior to that at 90 degrees.  The patient tolerated the procedure well. No complications.  Tourniquet time was 58 minutes.  Assistant, Lanna Poche, [place].  Minimal blood loss.     Jene Every, M.D.     Cordelia Pen  D:  07/07/2017  T:  07/07/2017  Job:  [id number]

## 2017-07-08 NOTE — Progress Notes (Addendum)
Patient ID: Dale Miller, male   DOB: 02/25/1946, 71 y.o.   MRN: [id number] Subjective: 1 Day Post-Op Procedure(s) (LRB): TOTAL RIGHT KNEE ARTHROPLASTY (Right) Patient reports pain as moderate.  Improved since increasing IV dilaudid and oxy dosage overnight. No numbness or tingling. No nausea. No other c/o.  Patient has complaints of R knee and leg pain  We will start therapy today. Plan is to go home with HHPT after hospital stay.  Objective: Vital signs in last 24 hours: Temp:  [96.8 F (36 C)-98.7 F (37.1 C)] 98 F (36.7 C) (08/24 0523) Pulse Rate:  [56-87] 84 (08/24 0523) Resp:  [8-33] 19 (08/24 0523) BP: (136-194)/(65-97) 194/97 (08/24 0523) SpO2:  [93 %-100 %] 98 % (08/24 0523) Weight:  [82.6 kg (182 lb)] 82.6 kg (182 lb) (08/23 0829)  Intake/Output from previous day:  Intake/Output Summary (Last 24 hours) at 07/08/17 0810 Last data filed at 07/08/17 0600  Gross per 24 hour  Intake          2373.33 ml  Output             2250 ml  Net           123.33 ml    Intake/Output this shift: No intake/output data recorded.  Labs: Results for orders placed or performed during the hospital encounter of 07/07/17  Potassium  Result Value Ref Range   Potassium 4.6 3.5 - 5.1 mmol/L  CBC  Result Value Ref Range   WBC 12.0 (H) 4.0 - 10.5 K/uL   RBC 4.77 4.22 - 5.81 MIL/uL   Hemoglobin 14.8 13.0 - 17.0 g/dL   HCT 40.9 81.1 - 91.4 %   MCV 93.1 78.0 - 100.0 fL   MCH 31.0 26.0 - 34.0 pg   MCHC 33.3 30.0 - 36.0 g/dL   RDW 78.2 95.6 - 21.3 %   Platelets 141 (L) 150 - 400 K/uL  Basic metabolic panel  Result Value Ref Range   Sodium 133 (L) 135 - 145 mmol/L   Potassium 4.2 3.5 - 5.1 mmol/L   Chloride 101 101 - 111 mmol/L   CO2 28 22 - 32 mmol/L   Glucose, Bld 155 (H) 65 - 99 mg/dL   BUN 14 6 - 20 mg/dL   Creatinine, Ser 0.86 0.61 - 1.24 mg/dL   Calcium 8.6 (L) 8.9 - 10.3 mg/dL   GFR calc non Af Amer >60 >60 mL/min   GFR calc Af Amer >60 >60 mL/min   Anion gap 4 (L) 5 - 15     Exam - Neurologically intact ABD soft Neurovascular intact Sensation intact distally Intact pulses distally Dorsiflexion/Plantar flexion intact Incision: dressing C/D/I and no drainage No cellulitis present Compartment soft no calf pain or sign of DVT Dressing - clean, dry, no drainage Motor function intact - moving foot and toes well on exam.   Assessment/Plan: 1 Day Post-Op Procedure(s) (LRB): TOTAL RIGHT KNEE ARTHROPLASTY (Right)  Advance diet Up with therapy D/C IV fluids Past Medical History:  Diagnosis Date  . Hypothyroidism   . Right knee meniscal tear   . Wears glasses   . Wears hearing aid    BILATERAL    DVT Prophylaxis - ASA Protocol Weight-Bearing as tolerated to Right leg No vaccines Will add ativan, HTN likely anxiety/pain related Will discuss with Dr. Shelle Iron Plan home with HHPT when ready- Sat/Sun depending on progress  BISSELL, JACLYN M. 07/08/2017, 8:10 AM

## 2017-07-08 NOTE — Evaluation (Signed)
Occupational Therapy Evaluation Patient Details Name: Dale Miller MRN: [id number] DOB: 1946-06-17 Today's Date: 07/08/2017    History of Present Illness Pt s/p R TKR    Clinical Impression   This 71 year old man was admitted for the above sx. He performed ADL during evaluation but was limited by pain.  Son will assist with adls at home. Will follow in acute setting to further educate on bathroom transfers    Follow Up Recommendations  No OT follow up;Supervision/Assistance - 24 hour    Equipment Recommendations  None recommended by OT    Recommendations for Other Services       Precautions / Restrictions Precautions Precautions: Knee;Fall Knee Immobilizer - Right: Discontinue once straight leg raise with < 10 degree lag Restrictions Weight Bearing Restrictions: No Other Position/Activity Restrictions: WBAT      Mobility Bed Mobility         Supine to sit: Min assist;HOB elevated     General bed mobility comments: assist for RLE  Transfers     Transfers: Sit to/from [employer] Sit to Stand: Min assist Stand pivot transfers: Min assist       General transfer comment: assist to rise and steady. Cues for UE/LE placement    Balance                                           ADL either performed or assessed with clinical judgement   ADL Overall ADL's : Needs assistance/impaired Eating/Feeding: Independent   Grooming: Set up;Sitting;Wash/dry hands;Wash/dry face   Upper Body Bathing: Set up;Sitting   Lower Body Bathing: Moderate assistance;Sit to/from stand   Upper Body Dressing : Set up;Sitting   Lower Body Dressing: Maximal assistance;Sit to/from stand   Toilet Transfer: Minimal assistance;Stand-pivot;RW Statistician Details (indicate cue type and reason): to chair Toileting- Clothing Manipulation and Hygiene: Min guard;Sit to/from stand         General ADL Comments: performed ADL from edge of bed and  transferred to chair. Pt with increased pain with weight bearing.  c/o lightheadedness BP 141/71 at EOB  Son will assist with adls at home as needed     Vision         Perception     Praxis      Pertinent Vitals/Pain Pain Score: 8  Pain Location: R knee with WB Pain Descriptors / Indicators: Aching Pain Intervention(s): Limited activity within patient's tolerance;Monitored during session;Premedicated before session;Repositioned;Ice applied     Hand Dominance     Extremity/Trunk Assessment Upper Extremity Assessment Upper Extremity Assessment: Overall WFL for tasks assessed           Communication     Cognition Arousal/Alertness: Awake/alert Behavior During Therapy: WFL for tasks assessed/performed Overall Cognitive Status: Within Functional Limits for tasks assessed                                     General Comments       Exercises     Shoulder Instructions      Home Living Family/patient expects to be discharged to:: Private residence Living Arrangements: Spouse/significant other;Children Available Help at Discharge: Family               Bathroom Shower/Tub: Walk-in Soil scientist Toilet: Handicapped height  Home Equipment: Grab bars - toilet;Shower seat          Prior Functioning/Environment Level of Independence: Independent                 OT Problem List: Decreased activity tolerance;Pain;Decreased knowledge of precautions;Decreased knowledge of use of DME or AE      OT Treatment/Interventions: Self-care/ADL training;DME and/or AE instruction;Patient/family education    OT Goals(Current goals can be found in the care plan section) Acute Rehab OT Goals Patient Stated Goal: Less pain OT Goal Formulation: With patient Time For Goal Achievement: 07/15/17 Potential to Achieve Goals: Good ADL Goals Pt Will Perform Grooming: with supervision;standing Pt Will Transfer to Toilet: with min guard  assist;ambulating;grab bars (high commode) Pt Will Perform Toileting - Clothing Manipulation and hygiene: with min guard assist;sit to/from stand Pt Will Perform Tub/Shower Transfer: Shower transfer;ambulating;with min guard assist;shower seat  OT Frequency: Min 2X/week   Barriers to D/C:            Co-evaluation              AM-PAC PT "6 Clicks" Daily Activity     Outcome Measure Help from another person eating meals?: None Help from another person taking care of personal grooming?: A Little Help from another person toileting, which includes using toliet, bedpan, or urinal?: A Little Help from another person bathing (including washing, rinsing, drying)?: A Lot Help from another person to put on and taking off regular upper body clothing?: A Little Help from another person to put on and taking off regular lower body clothing?: A Lot 6 Click Score: 17   End of Session    Activity Tolerance: Patient limited by pain Patient left: in chair;with call bell/phone within reach  OT Visit Diagnosis: Pain Pain - Right/Left: Right Pain - part of body: Knee                Time: 4098-1191 OT Time Calculation (min): 40 min Charges:  OT General Charges $OT Visit: 1 Procedure OT Evaluation $OT Eval Low Complexity: 1 Procedure OT Treatments $Self Care/Home Management : 8-22 mins G-Codes:     [place], OTR/L [phone] 07/08/2017  Dale Miller 07/08/2017, 10:17 AM

## 2017-07-08 NOTE — Progress Notes (Signed)
Physical Therapy Treatment Patient Details Name: Dale Miller MRN: [id number] DOB: Jul 27, 1946 Today's Date: 07/08/2017    History of Present Illness Pt s/p R TKR     PT Comments    POD # 1 pm session Assisted with applying KI and instructing in use for amb.  Assisted from recliner to amb to bathroom.  Assisted  With static standing at toilet and sink.  <25% VC's on safety with turns and negociating walker in tight spaces.  Assisted with amb in hallway.  Distance limited by increased pain and fatigue.  Returned to room and positioned in recliner.  Applied ICE.  Pt plans to D/C tomorrow.  Follow Up Recommendations  DC plan and follow up therapy as arranged by surgeon     Equipment Recommendations  None recommended by PT    Recommendations for Other Services       Precautions / Restrictions Precautions Precautions: Knee;Fall Precaution Comments: instructed on KI use for amb Required Braces or Orthoses: Knee Immobilizer - Right Knee Immobilizer - Right: Discontinue once straight leg raise with < 10 degree lag Restrictions Weight Bearing Restrictions: No Other Position/Activity Restrictions: WBAT    Mobility  Bed Mobility               General bed mobility comments: OOB in recliner  Transfers Overall transfer level: Needs assistance Equipment used: Rolling walker (2 wheeled) Transfers: Sit to/from [employer] Sit to Stand: Min guard;Min assist Stand pivot transfers: Min assist;Min guard       General transfer comment: assist to rise and steady. Cues for UE/LE placement  Ambulation/Gait Ambulation/Gait assistance: Min assist Ambulation Distance (Feet): 48 Feet Assistive device: Rolling walker (2 wheeled) Gait Pattern/deviations: Step-to pattern;Decreased step length - right;Decreased step length - left;Shuffle;Trunk flexed Gait velocity: decreased   General Gait Details: cues for sequence, posture and position from RW.  Distance ltd by c/o  pain and fatigue   Stairs            Wheelchair Mobility    Modified Rankin (Stroke Patients Only)       Balance                                            Cognition Arousal/Alertness: Awake/alert Behavior During Therapy: WFL for tasks assessed/performed Overall Cognitive Status: Within Functional Limits for tasks assessed                                        Exercises      General Comments        Pertinent Vitals/Pain Pain Assessment: 0-10 Pain Score: 8  Pain Location: R knee with WB Pain Descriptors / Indicators: Aching;Operative site guarding Pain Intervention(s): Monitored during session;Repositioned;Ice applied    Home Living                      Prior Function            PT Goals (current goals can now be found in the care plan section) Progress towards PT goals: Progressing toward goals    Frequency    7X/week      PT Plan Current plan remains appropriate    Co-evaluation              AM-PAC PT "  6 Clicks" Daily Activity  Outcome Measure  Difficulty turning over in bed (including adjusting bedclothes, sheets and blankets)?: Unable Difficulty moving from lying on back to sitting on the side of the bed? : Unable Difficulty sitting down on and standing up from a chair with arms (e.g., wheelchair, bedside commode, etc,.)?: Unable Help needed moving to and from a bed to chair (including a wheelchair)?: Total Help needed walking in hospital room?: Total Help needed climbing 3-5 steps with a railing? : Total 6 Click Score: 6    End of Session Equipment Utilized During Treatment: Gait belt;Right knee immobilizer Activity Tolerance: Patient tolerated treatment well Patient left: in chair;with call bell/phone within reach Nurse Communication: Mobility status PT Visit Diagnosis: Difficulty in walking, not elsewhere classified (R26.2);Pain Pain - Right/Left: Right Pain - part of body: Knee      Time: 1610-9604 PT Time Calculation (min) (ACUTE ONLY): 25 min  Charges:  $Gait Training: 8-22 mins $Therapeutic Activity: 8-22 mins                    G Codes:       {Audryna Wendt  PTA [facility]  Acute  Rehab Pager      [phone]

## 2017-07-08 NOTE — Progress Notes (Signed)
Called and paged Dr.Norris in regards to patients hypertension. Waiting on return call at this time.

## 2017-07-08 NOTE — Progress Notes (Signed)
Discharge planning, spoke with patient at beside. Chose [facility] for [facility] services, contacted [facility] for referral. Has all DME already. [phone]

## 2017-07-09 LAB — CBC
HCT: 44.2 % (ref 39.0–52.0)
Hemoglobin: 15.2 g/dL (ref 13.0–17.0)
MCH: 31.4 pg (ref 26.0–34.0)
MCHC: 34.4 g/dL (ref 30.0–36.0)
MCV: 91.3 fL (ref 78.0–100.0)
PLATELETS: 163 10*3/uL (ref 150–400)
RBC: 4.84 MIL/uL (ref 4.22–5.81)
RDW: 12.8 % (ref 11.5–15.5)
WBC: 14.4 10*3/uL — ABNORMAL HIGH (ref 4.0–10.5)

## 2017-07-09 MED ORDER — CHLORPROMAZINE HCL 25 MG PO TABS
25.0000 mg | ORAL_TABLET | Freq: Three times a day (TID) | ORAL | Status: DC | PRN
  Administered 2017-07-09: 23:00:00 25 mg via ORAL
  Filled 2017-07-09 (×2): qty 1

## 2017-07-09 MED ORDER — DIAZEPAM 5 MG/ML IJ SOLN: 2.5000 mg | Freq: Once | INTRAMUSCULAR | Status: DC

## 2017-07-09 MED ORDER — DIAZEPAM 5 MG PO TABS
5.0000 mg | ORAL_TABLET | Freq: Once | ORAL | Status: AC
  Administered 2017-07-09: 18:00:00 5 mg via ORAL
  Filled 2017-07-09: qty 1

## 2017-07-09 NOTE — Progress Notes (Signed)
   Subjective: 2 Days Post-Op Procedure(s) (LRB): TOTAL RIGHT KNEE ARTHROPLASTY (Right)  Pt feeling better this morning Feels like he has turned the corner in regards to recovering Denies any new symptoms or issues Plan to go home tomorrow Patient reports pain as moderate.  Objective:   VITALS:   Vitals:   07/09/17 0119 07/09/17 0500  BP: (!) 181/80 (!) 174/80  Pulse: 92 (!) 101  Resp: 17 16  Temp: 98.5 F (36.9 C) 98.9 F (37.2 C)  SpO2: 97% 96%    Right knee incision healing well Dressing in place Ace removed nv intact distally  LABS  Recent Labs  07/08/17 0607 07/09/17 0522  HGB 14.8 15.2  HCT 44.4 44.2  WBC 12.0* 14.4*  PLT 141* 163     Recent Labs  07/07/17 0823 07/08/17 0607  NA  --  133*  K 4.6 4.2  BUN  --  14  CREATININE  --  0.84  GLUCOSE  --  155*     Assessment/Plan: 2 Days Post-Op Procedure(s) (LRB): TOTAL RIGHT KNEE ARTHROPLASTY (Right) Continue PT/OT Encourage mobilization Pain management as needed Plan for d/c home tomorrow    Alphonsa Overall, MPAS, PA-C  07/09/2017, 7:51 AM

## 2017-07-09 NOTE — Progress Notes (Signed)
Physical Therapy Treatment Patient Details Name: Dale Miller MRN: [id number] DOB: 09/24/1946 Today's Date: 07/09/2017    History of Present Illness Pt s/p R TKR     PT Comments    Pt progressing, incr gait distance this pm although very slow gait speed; pt hopes  to D/C home  tomorrow   Follow Up Recommendations  DC plan and follow up therapy as arranged by surgeon     Equipment Recommendations  None recommended by PT    Recommendations for Other Services OT consult     Precautions / Restrictions Precautions Precautions: Knee;Fall Precaution Comments: instructed on KI use for amb Required Braces or Orthoses: Knee Immobilizer - Right Knee Immobilizer - Right: Discontinue once straight leg raise with < 10 degree lag Restrictions Weight Bearing Restrictions: No Other Position/Activity Restrictions: WBAT    Mobility  Bed Mobility Overal bed mobility: Needs Assistance Bed Mobility: Supine to Sit     Supine to sit: Min assist     General bed mobility comments: OOB by PT  Transfers Overall transfer level: Needs assistance Equipment used: Rolling walker (2 wheeled) Transfers: Sit to/from Stand Sit to Stand: Min guard         General transfer comment: for safety  Ambulation/Gait Ambulation/Gait assistance: Min assist;Min guard Ambulation Distance (Feet): 65 Feet Assistive device: Rolling walker (2 wheeled) Gait Pattern/deviations: Step-to pattern;Decreased step length - right;Decreased step length - left;Shuffle;Trunk flexed Gait velocity: decreased   General Gait Details: cues for incr WBing RLE   Stairs            Wheelchair Mobility    Modified Rankin (Stroke Patients Only)       Balance                                            Cognition Arousal/Alertness: Awake/alert Behavior During Therapy: WFL for tasks assessed/performed Overall Cognitive Status: Within Functional Limits for tasks assessed Area of Impairment:  Memory                     Memory: Decreased short-term memory         General Comments: pt denies any knowledge of performing ex's previously, denies or does not recall KI application depsite educ previous day by another therapist      Exercises Total Joint Exercises Ankle Circles/Pumps: AROM;Both;15 reps Quad Sets: AROM;Both;10 reps    General Comments        Pertinent Vitals/Pain Pain Assessment: 0-10 Pain Score: 5  Pain Location: R knee with activity Pain Descriptors / Indicators: Aching Pain Intervention(s): Limited activity within patient's tolerance;Monitored during session;Premedicated before session;Ice applied    Home Living                      Prior Function            PT Goals (current goals can now be found in the care plan section) Acute Rehab PT Goals Patient Stated Goal: Less pain PT Goal Formulation: With patient Time For Goal Achievement: 07/11/17 Potential to Achieve Goals: Good Progress towards PT goals: Progressing toward goals    Frequency    7X/week      PT Plan Current plan remains appropriate    Co-evaluation              AM-PAC PT "6 Clicks" Daily Activity  Outcome Measure  Difficulty turning over in bed (including adjusting bedclothes, sheets and blankets)?: Unable Difficulty moving from lying on back to sitting on the side of the bed? : Unable Difficulty sitting down on and standing up from a chair with arms (e.g., wheelchair, bedside commode, etc,.)?: Unable Help needed moving to and from a bed to chair (including a wheelchair)?: A Little Help needed walking in hospital room?: A Little Help needed climbing 3-5 steps with a railing? : A Little 6 Click Score: 12    End of Session Equipment Utilized During Treatment: Gait belt;Right knee immobilizer Activity Tolerance: Patient tolerated treatment well Patient left: in chair;with call bell/phone within reach;with chair alarm set Nurse Communication:  Mobility status PT Visit Diagnosis: Difficulty in walking, not elsewhere classified (R26.2);Pain Pain - Right/Left: Right Pain - part of body: Knee     Time: 1358-1430 PT Time Calculation (min) (ACUTE ONLY): 32 min  Charges:  $Gait Training: 23-37 mins                    G CodesDrucilla Chalet, PT Pager: [phone] 07/09/2017    Drucilla Chalet 07/09/2017, 2:38 PM

## 2017-07-09 NOTE — Progress Notes (Signed)
07/08/17 2000 Nursing Dr Ranell Patrick called reg patient's bp in the 180's at this ttime. No orders recieved

## 2017-07-09 NOTE — Progress Notes (Signed)
Occupational Therapy Treatment Patient Details Name: Dale Miller MRN: [id number] DOB: 1946-05-22 Today's Date: 07/09/2017    History of present illness Pt s/p R TKR    OT comments  Ambulated to bathroom and performed toilet transfer.  Pt did have one LOB requiring therapist's assistance to avoid falling posteriorly.  Follow Up Recommendations  No OT follow up;Supervision/Assistance - 24 hour    Equipment Recommendations  None recommended by OT    Recommendations for Other Services      Precautions / Restrictions Precautions Precautions: Knee;Fall Precaution Comments: instructed on KI use for amb Required Braces or Orthoses: Knee Immobilizer - Right Knee Immobilizer - Right: Discontinue once straight leg raise with < 10 degree lag Restrictions Weight Bearing Restrictions: No Other Position/Activity Restrictions: WBAT       Mobility Bed Mobility Overal bed mobility: Needs Assistance Bed Mobility: Supine to Sit     Supine to sit: Min assist     General bed mobility comments: OOB by PT  Transfers Overall transfer level: Needs assistance Equipment used: Rolling walker (2 wheeled) Transfers: Sit to/from Stand Sit to Stand: Min guard         General transfer comment: for safety, cues for LE placement    Balance                                           ADL either performed or assessed with clinical judgement   ADL                           Toilet Transfer: Minimal assistance;Ambulation;Comfort height toilet;Grab bars             General ADL Comments: ambulated to bathroom slowly.  Pt had hiccups which went away after he sat and relaxed on toilet. Pt had LOB posteriorly walking to bathroom once, requiring support of therapist to avoid falling.  Demonstrated shower transfer but did not practice this visit.  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      Cognition Arousal/Alertness:  Awake/alert Behavior During Therapy: WFL for tasks assessed/performed                                         Exercises    Shoulder Instructions       General Comments      Pertinent Vitals/ Pain       Pain Assessment: 0-10 Pain Score: 5  Pain Location: R knee with WB Pain Descriptors / Indicators: Aching Pain Intervention(s): Limited activity within patient's tolerance;Monitored during session;Premedicated before session;Repositioned  Home Living                                          Prior Functioning/Environment              Frequency           Progress Toward Goals  OT Goals(current goals can now be found in the care plan section)  Progress towards OT goals: Progressing toward goals (slowly)  Acute Rehab OT Goals Patient Stated Goal: Less pain  Plan  Co-evaluation                 AM-PAC PT "6 Clicks" Daily Activity     Outcome Measure   Help from another person eating meals?: None Help from another person taking care of personal grooming?: A Little Help from another person toileting, which includes using toliet, bedpan, or urinal?: A Little Help from another person bathing (including washing, rinsing, drying)?: A Lot Help from another person to put on and taking off regular upper body clothing?: A Little Help from another person to put on and taking off regular lower body clothing?: A Lot 6 Click Score: 17    End of Session    OT Visit Diagnosis: Pain Pain - Right/Left: Right Pain - part of body: Knee   Activity Tolerance Patient limited by fatigue   Patient Left in chair;with call bell/phone within reach   Nurse Communication          Time: 9604-5409 OT Time Calculation (min): 23 min  Charges: OT General Charges $OT Visit: 1 Procedure OT Treatments $Self Care/Home Management : 8-22 mins  Marica Otter, OTR/L [phone] 07/09/2017   Vieva Brummitt 07/09/2017, 12:55  PM

## 2017-07-09 NOTE — Progress Notes (Signed)
Physical Therapy Treatment Patient Details Name: Dale Miller MRN: [id number] DOB: 1946-05-11 Today's Date: 07/09/2017    History of Present Illness Pt s/p R TKR     PT Comments    Pt progressing, feels pain is better today   Follow Up Recommendations  DC plan and follow up therapy as arranged by surgeon     Equipment Recommendations  None recommended by PT    Recommendations for Other Services OT consult     Precautions / Restrictions Precautions Precautions: Knee;Fall Precaution Comments: instructed on KI use for amb Required Braces or Orthoses: Knee Immobilizer - Right Knee Immobilizer - Right: Discontinue once straight leg raise with < 10 degree lag Restrictions Weight Bearing Restrictions: No Other Position/Activity Restrictions: WBAT    Mobility  Bed Mobility Overal bed mobility: Needs Assistance Bed Mobility: Supine to Sit     Supine to sit: Min assist     General bed mobility comments: cues to self assist  Transfers Overall transfer level: Needs assistance Equipment used: Rolling walker (2 wheeled) Transfers: Sit to/from Stand Sit to Stand: Min guard         General transfer comment: min/guard to rise and steady. Cues for UE/LE placement  Ambulation/Gait Ambulation/Gait assistance: Min assist;Min guard Ambulation Distance (Feet): 55 Feet Assistive device: Rolling walker (2 wheeled) Gait Pattern/deviations: Step-to pattern;Decreased step length - right;Decreased step length - left;Shuffle;Trunk flexed Gait velocity: decreased   General Gait Details: cues for sequence, posture and position from RW.     Stairs            Wheelchair Mobility    Modified Rankin (Stroke Patients Only)       Balance                                            Cognition Arousal/Alertness: Awake/alert Behavior During Therapy: WFL for tasks assessed/performed   Area of Impairment: Memory                     Memory:  Decreased short-term memory         General Comments: pt denies any knowledge of performing ex's previously, denies or does not recall KI application depsite educ previous day by another therapist      Exercises Total Joint Exercises Ankle Circles/Pumps: AROM;Both;15 reps Quad Sets: AROM;Both;10 reps    General Comments        Pertinent Vitals/Pain Pain Assessment: 0-10 Pain Score: 2  Pain Location: R knee with WB Pain Descriptors / Indicators: Aching;Operative site guarding Pain Intervention(s): Limited activity within patient's tolerance;Monitored during session;Repositioned;Ice applied    Home Living                      Prior Function            PT Goals (current goals can now be found in the care plan section) Acute Rehab PT Goals Patient Stated Goal: Less pain PT Goal Formulation: With patient Time For Goal Achievement: 07/11/17 Potential to Achieve Goals: Good Progress towards PT goals: Progressing toward goals    Frequency    7X/week      PT Plan Current plan remains appropriate    Co-evaluation              AM-PAC PT "6 Clicks" Daily Activity  Outcome Measure  Difficulty turning over in bed (including adjusting  bedclothes, sheets and blankets)?: Unable Difficulty moving from lying on back to sitting on the side of the bed? : Unable Difficulty sitting down on and standing up from a chair with arms (e.g., wheelchair, bedside commode, etc,.)?: Unable Help needed moving to and from a bed to chair (including a wheelchair)?: A Little Help needed walking in hospital room?: A Little Help needed climbing 3-5 steps with a railing? : A Lot 6 Click Score: 11    End of Session Equipment Utilized During Treatment: Gait belt;Right knee immobilizer Activity Tolerance: Patient tolerated treatment well Patient left: in chair;with call bell/phone within reach;with chair alarm set   PT Visit Diagnosis: Difficulty in walking, not elsewhere  classified (R26.2);Pain Pain - Right/Left: Right Pain - part of body: Knee     Time: 6295-2841 PT Time Calculation (min) (ACUTE ONLY): 23 min  Charges:  $Gait Training: 23-37 mins                    G Codes:          [name] 08/07/17, 11:17 AM

## 2017-07-10 LAB — CBC
HEMATOCRIT: 40.3 % (ref 39.0–52.0)
Hemoglobin: 14.1 g/dL (ref 13.0–17.0)
MCH: 31.5 pg (ref 26.0–34.0)
MCHC: 35 g/dL (ref 30.0–36.0)
MCV: 90 fL (ref 78.0–100.0)
Platelets: 137 10*3/uL — ABNORMAL LOW (ref 150–400)
RBC: 4.48 MIL/uL (ref 4.22–5.81)
RDW: 12.8 % (ref 11.5–15.5)
WBC: 9.2 10*3/uL (ref 4.0–10.5)

## 2017-07-10 NOTE — Progress Notes (Addendum)
Physical Therapy Treatment Patient Details Name: Dale Miller MRN: [id number] DOB: 07-25-1946 Today's Date: 07/10/2017    History of Present Illness Pt s/p R TKR     PT Comments    Stair training and review of HEP completed with pt's son and wife. Pt is ready to DC from PT standpoint. He will need a RW, pt's son stated they have a rollator and knee walker, but not a RW.   Follow Up Recommendations  DC plan and follow up therapy as arranged by surgeon     Equipment Recommendations  RW with 5" wheels    Recommendations for Other Services       Precautions / Restrictions Precautions Precautions: Knee;Fall Precaution Comments: Pt and family instructed on KI use for amb Required Braces or Orthoses: Knee Immobilizer - Right Knee Immobilizer - Right: Discontinue once straight leg raise with < 10 degree lag Restrictions Weight Bearing Restrictions: No Other Position/Activity Restrictions: WBAT    Mobility  Bed Mobility Overal bed mobility: Modified Independent Bed Mobility: Supine to Sit     Supine to sit: Modified independent (Device/Increase time)        Transfers Overall transfer level: Needs assistance Equipment used: Rolling walker (2 wheeled) Transfers: Sit to/from Stand Sit to Stand: Min guard         General transfer comment: VCs hand placement  Ambulation/Gait Ambulation/Gait assistance: Min guard Ambulation Distance (Feet): 50 Feet Assistive device: Rolling walker (2 wheeled) Gait Pattern/deviations: Step-to pattern;Shuffle;Trunk flexed;Decreased stride length Gait velocity: decreased Gait velocity interpretation: Below normal speed for age/gender General Gait Details: cues for sequencing and to incr step length   Stairs Stairs: Yes   Stair Management: Step to pattern;Forwards;Backwards;One rail Left;With cane;With walker Number of Stairs: 5 General stair comments: 3 steps forward with L rail and SPC to simulate steps to enter; then 2 steps  backwards with RW to simulate 2 steps to living room with no rails; son and wife present for stair training  Wheelchair Mobility    Modified Rankin (Stroke Patients Only)       Balance Overall balance assessment: Needs assistance   Sitting balance-Leahy Scale: Good       Standing balance-Leahy Scale: Fair                              Cognition Arousal/Alertness: Awake/alert Behavior During Therapy: WFL for tasks assessed/performed Overall Cognitive Status: Within Functional Limits for tasks assessed                       Memory: Decreased short-term memory                Exercises Total Joint Exercises Ankle Circles/Pumps: AROM;Both;10 reps Quad Sets:  (attempted, no palpable quad contraction though) Short Arc Quad: AAROM;Right;Supine;5 reps Heel Slides: AAROM;Right;10 reps;Supine Hip ABduction/ADduction: AAROM;Right;10 reps;Supine Straight Leg Raises: AAROM;Right;Supine;5 reps Goniometric ROM: 10-40* AAROM R knee    General Comments        Pertinent Vitals/Pain Pain Score: 5  Pain Location: R knee with activity Pain Descriptors / Indicators: Aching Pain Intervention(s): Limited activity within patient's tolerance;Monitored during session;Premedicated before session;Ice applied    Home Living                      Prior Function            PT Goals (current goals can now be found in the care plan section)  Acute Rehab PT Goals Patient Stated Goal: to travel to visit family in [place] PT Goal Formulation: With patient Time For Goal Achievement: 07/11/17 Potential to Achieve Goals: Good Progress towards PT goals: Progressing toward goals    Frequency    7X/week      PT Plan Current plan remains appropriate    Co-evaluation              AM-PAC PT "6 Clicks" Daily Activity  Outcome Measure  Difficulty turning over in bed (including adjusting bedclothes, sheets and blankets)?: A Little Difficulty  moving from lying on back to sitting on the side of the bed? : A Little Difficulty sitting down on and standing up from a chair with arms (e.g., wheelchair, bedside commode, etc,.)?: A Little Help needed moving to and from a bed to chair (including a wheelchair)?: A Little Help needed walking in hospital room?: A Little Help needed climbing 3-5 steps with a railing? : A Little 6 Click Score: 18    End of Session Equipment Utilized During Treatment: Gait belt;Right knee immobilizer Activity Tolerance: Patient tolerated treatment well Patient left: in chair;with call bell/phone within reach Nurse Communication: Mobility status PT Visit Diagnosis: Difficulty in walking, not elsewhere classified (R26.2);Pain Pain - Right/Left: Right Pain - part of body: Knee     Time: 1497-0263 PT Time Calculation (min) (ACUTE ONLY): 27 min  Charges:  $Gait Training: 8-22 mins $Therapeutic Exercise: 8-22 mins $Therapeutic Activity: 8-22 mins                    G Codes:          Tamala Ser 07/10/2017, 11:12 AM [phone]

## 2017-07-10 NOTE — Progress Notes (Signed)
Subjective: 3 Days Post-Op Procedure(s) (LRB): TOTAL RIGHT KNEE ARTHROPLASTY (Right) Patient reports pain as well controlled.  Hiccups continue but are improving. Tolerating PO's. Denies CP,SOB, or calf pain.  Objective: Vital signs in last 24 hours: Temp:  [97.3 F (36.3 C)-99.3 F (37.4 C)] 99 F (37.2 C) (08/26 0442) Pulse Rate:  [82-98] 98 (08/26 0442) Resp:  [16-20] 20 (08/26 0442) BP: (135-142)/(67-80) 136/67 (08/26 0442) SpO2:  [94 %-100 %] 94 % (08/26 0442)  Intake/Output from previous day: 08/25 0701 - 08/26 0700 In: 600 [P.O.:600] Out: 1250 [Urine:1250] Intake/Output this shift: No intake/output data recorded.   Recent Labs  07/08/17 0607 07/09/17 0522 07/10/17 0530  HGB 14.8 15.2 14.1    Recent Labs  07/09/17 0522 07/10/17 0530  WBC 14.4* 9.2  RBC 4.84 4.48  HCT 44.2 40.3  PLT 163 137*    Recent Labs  07/07/17 0823 07/08/17 0607  NA  --  133*  K 4.6 4.2  CL  --  101  CO2  --  28  BUN  --  14  CREATININE  --  0.84  GLUCOSE  --  155*  CALCIUM  --  8.6*   No results for input(s): LABPT, INR in the last 72 hours.  Well nourished. Alert and oriented x3. RRR, Lungs clear, BS x4. Abdomen soft and non tender. Right Calf soft and non tender. Right knee dressing C/D/I. No DVT signs. Compartment soft. No signs of infection.  Right LE grossly neurovascular intact.  Assessment/Plan: 3 Days Post-Op Procedure(s) (LRB): TOTAL RIGHT KNEE ARTHROPLASTY (Right) D/c home Up with PT Follow instructions Continue current care  Hiccups: Monitor  Follow up as directed  Mckaylie Vasey L 07/10/2017, 8:01 AM

## 2017-07-10 NOTE — Progress Notes (Addendum)
Physical Therapy Treatment Patient Details Name: Dale Miller MRN: [id number] DOB: 10/30/1946 Today's Date: 07/10/2017    History of Present Illness Pt s/p R TKR     PT Comments    Pt tolerated increased distance with ambulation today, he continues to require verbal cues for sequencing for walking. Reviewed HEP. Pt has very weak quad set and -2/5 strength for  SLR.  Will return later this morning to do stair training when pt's son arrives. Then expect he'll be ready to DC from PT standpoint.   Follow Up Recommendations  DC plan and follow up therapy as arranged by surgeon     Equipment Recommendations  None recommended by PT    Recommendations for Other Services       Precautions / Restrictions Precautions Precautions: Knee;Fall Precaution Comments: instructed on KI use for amb Required Braces or Orthoses: Knee Immobilizer - Right Knee Immobilizer - Right: Discontinue once straight leg raise with < 10 degree lag Restrictions Weight Bearing Restrictions: No Other Position/Activity Restrictions: WBAT    Mobility  Bed Mobility Overal bed mobility: Modified Independent Bed Mobility: Supine to Sit     Supine to sit: Modified independent (Device/Increase time)        Transfers Overall transfer level: Needs assistance Equipment used: Rolling walker (2 wheeled) Transfers: Sit to/from Stand Sit to Stand: Min guard         General transfer comment: VCs hand placement  Ambulation/Gait Ambulation/Gait assistance: Min guard Ambulation Distance (Feet): 120 Feet Assistive device: Rolling walker (2 wheeled) Gait Pattern/deviations: Step-to pattern;Shuffle;Trunk flexed;Decreased stride length Gait velocity: decreased Gait velocity interpretation: Below normal speed for age/gender General Gait Details: cues for sequencing and to incr step length   Stairs            Wheelchair Mobility    Modified Rankin (Stroke Patients Only)       Balance Overall  balance assessment: Needs assistance   Sitting balance-Leahy Scale: Good       Standing balance-Leahy Scale: Fair                              Cognition Arousal/Alertness: Awake/alert Behavior During Therapy: WFL for tasks assessed/performed Overall Cognitive Status: Within Functional Limits for tasks assessed                       Memory: Decreased short-term memory                Exercises Total Joint Exercises Ankle Circles/Pumps: AROM;Both;10 reps Quad Sets:  (attempted, no palpable quad contraction though) Short Arc Quad: AAROM;Right;10 reps;Supine Heel Slides: AAROM;Right;10 reps;Supine Hip ABduction/ADduction: AAROM;Right;10 reps;Supine Straight Leg Raises: AAROM;Right;10 reps;Supine Goniometric ROM: 10-40* AAROM R knee    General Comments        Pertinent Vitals/Pain Pain Score: 3  Pain Location: R knee with activity Pain Intervention(s): Limited activity within patient's tolerance;Monitored during session;RN gave pain meds during session;Ice applied    Home Living                      Prior Function            PT Goals (current goals can now be found in the care plan section) Acute Rehab PT Goals Patient Stated Goal: to travel to visit family in [place] PT Goal Formulation: With patient Time For Goal Achievement: 07/11/17 Potential to Achieve Goals: Good Progress towards PT goals: Progressing  toward goals    Frequency    7X/week      PT Plan Current plan remains appropriate    Co-evaluation              AM-PAC PT "6 Clicks" Daily Activity  Outcome Measure  Difficulty turning over in bed (including adjusting bedclothes, sheets and blankets)?: A Little Difficulty moving from lying on back to sitting on the side of the bed? : A Little Difficulty sitting down on and standing up from a chair with arms (e.g., wheelchair, bedside commode, etc,.)?: A Little Help needed moving to and from a bed to chair  (including a wheelchair)?: A Little Help needed walking in hospital room?: A Little Help needed climbing 3-5 steps with a railing? : A Little 6 Click Score: 18    End of Session Equipment Utilized During Treatment: Gait belt;Right knee immobilizer Activity Tolerance: Patient tolerated treatment well Patient left: in chair;with call bell/phone within reach Nurse Communication: Mobility status PT Visit Diagnosis: Difficulty in walking, not elsewhere classified (R26.2);Pain Pain - Right/Left: Right Pain - part of body: Knee     Time: 1610-9604 PT Time Calculation (min) (ACUTE ONLY): 52 min  Charges:  $Gait Training: 8-22 mins $Therapeutic Exercise: 8-22 mins $Therapeutic Activity: 8-22 mins                    G Codes:     Tamala Ser 07/10/2017, 9:29 AM [phone]

## 2017-07-10 NOTE — Discharge Summary (Signed)
Physician Discharge Summary  Patient ID: Dale Miller MRN: [id number] DOB/AGE: 72-Aug-1947 72 y.o.  Admit date: 07/07/2017 Discharge date: 07/10/2017  Admission Diagnoses: Right knee primary OA  Discharge Diagnoses:  Principal Problem:   Primary osteoarthritis of right knee Active Problems:   Right knee DJD   Discharged Condition: good  Hospital Course:  Dale Miller is a 71 y.o. who was admitted to [facility]. They were brought to the operating room on 07/07/2017 and underwent Procedure(s): TOTAL RIGHT KNEE ARTHROPLASTY.  Patient tolerated the procedure well and was later transferred to the recovery room and then to the orthopaedic floor for postoperative care.  They were given PO and IV analgesics for pain control following their surgery.  They were given 24 hours of postoperative antibiotics of  Anti-infectives    Start     Dose/Rate Route Frequency Ordered Stop   07/07/17 2200  vancomycin (VANCOCIN) IVPB 1000 mg/200 mL premix     1,000 mg 200 mL/hr over 60 Minutes Intravenous Every 12 hours 07/07/17 1403 07/07/17 2246   07/07/17 1110  polymyxin B 500,000 Units, bacitracin 50,000 Units in sodium chloride 0.9 % 500 mL irrigation  Status:  Discontinued       As needed 07/07/17 1110 07/07/17 1240   07/07/17 0815  ceFAZolin (ANCEF) IVPB 2g/100 mL premix     2 g 200 mL/hr over 30 Minutes Intravenous On call to O.R. 07/07/17 0355 07/07/17 1113   07/07/17 0815  vancomycin (VANCOCIN) IVPB 1000 mg/200 mL premix     1,000 mg 200 mL/hr over 60 Minutes Intravenous On call to O.R. 07/07/17 9741 07/07/17 1045     and started on DVT prophylaxis.   PT and OT were ordered for total joint protocol.  Discharge planning consulted to help with postop disposition and equipment needs.  Patient had a good night on the evening of surgery and started to get up OOB with therapy on day one.  Continued to work with therapy into day two.  Dressing was with normal limits.  The patient had progressed  with therapy and meeting their goals. Patient was seen in rounds and was ready to go home.  Consults: n/a  Significant Diagnostic Studies: routine  Treatments:routine  Discharge Exam: Blood pressure 136/67, pulse 98, temperature 99 F (37.2 C), temperature source Oral, resp. rate 20, height 5\' 4"  (1.626 m), weight 82.6 kg (182 lb), SpO2 94 %. Well nourished. Alert and oriented x3. RRR, Lungs clear, BS x4. Abdomen soft and non tender. Right Calf soft and non tender. Right knee dressing C/D/I. No DVT signs. Compartment soft. No signs of infection.  Right LE grossly neurovascular intact.  Disposition: 01-Home or Self Care   Allergies as of 07/10/2017   No Known Allergies     Medication List    TAKE these medications   aspirin EC 325 MG tablet Take 1 tablet (325 mg total) by mouth 2 (two) times daily.   docusate sodium 100 MG capsule Commonly known as:  COLACE Take 1 capsule (100 mg total) by mouth 2 (two) times daily as needed for mild constipation.   levothyroxine 50 MCG tablet Commonly known as:  SYNTHROID, LEVOTHROID Take 50 mcg by mouth daily before breakfast.   oxyCODONE-acetaminophen 5-325 MG tablet Commonly known as:  PERCOCET Take 1-2 tablets by mouth every 4 (four) hours as needed for severe pain.   polyethylene glycol packet Commonly known as:  MIRALAX / GLYCOLAX Take 17 g by mouth daily.  Discharge Care Instructions        Start     Ordered   07/07/17 0000  oxyCODONE-acetaminophen (PERCOCET) 5-325 MG tablet  Every 4 hours PRN     07/07/17 1230   07/07/17 0000  polyethylene glycol (MIRALAX / GLYCOLAX) packet  Daily     07/07/17 1230   07/07/17 0000  docusate sodium (COLACE) 100 MG capsule  2 times daily PRN     07/07/17 1230   07/07/17 0000  aspirin EC 325 MG tablet  2 times daily     07/07/17 1230     Follow-up Information    Jene Every, MD Follow up in 2 week(s).   Specialty:  Orthopedic Surgery Contact information: [address] [place] [address] [phone]        Jene Every, MD Follow up in 2 week(s).   Specialty:  Orthopedic Surgery Contact information: [address] [place] [address] [phone]        [facility] Follow up.   Why:  phsyical therapy Contact information: [address] [place] [address] [phone]           Signed: Markham Jordan 07/10/2017, 8:02 AM

## 2017-07-10 NOTE — Progress Notes (Signed)
Occupational Therapy Treatment Patient Details Name: Dale Miller MRN: [id number] DOB: 11/23/1945 Today's Date: 07/10/2017    History of present illness Pt s/p R TKR    OT comments  Pt is progressing toward OT goals.  He requires min guard assist for functional transfers - requires cues for safety and sequencing.  Deferred shower transfer training to HHPT as family with difficulty explaining home set up accurately.     Follow Up Recommendations       Equipment Recommendations  None recommended by OT    Recommendations for Other Services      Precautions / Restrictions Precautions Precautions: Knee;Fall Precaution Comments: Pt and family instructed on KI use for amb Required Braces or Orthoses: Knee Immobilizer - Right Knee Immobilizer - Right: Discontinue once straight leg raise with < 10 degree lag Restrictions Weight Bearing Restrictions: No Other Position/Activity Restrictions: WBAT       Mobility Bed Mobility Overal bed mobility: Modified Independent Bed Mobility: Supine to Sit     Supine to sit: Modified independent (Device/Increase time)     General bed mobility comments: in chair  Transfers Overall transfer level: Needs assistance Equipment used: Rolling walker (2 wheeled) Transfers: Sit to/from [employer] Sit to Stand: Min guard Stand pivot transfers: Min guard       General transfer comment: verbal cues for hand placement and safe technique     Balance Overall balance assessment: Needs assistance Sitting-balance support: Feet supported Sitting balance-Leahy Scale: Good     Standing balance support: During functional activity Standing balance-Leahy Scale: Fair                             ADL either performed or assessed with clinical judgement   ADL Overall ADL's : Needs assistance/impaired     Grooming: Wash/dry hands;Wash/dry face;Min Production designer, theatre/television/film: Min  guard;Ambulation;Comfort height toilet;Grab bars;BSC;RW Toilet Transfer Details (indicate cue type and reason): verbal cues for sequencing and safe technique  Toileting- Clothing Manipulation and Hygiene: Min guard;Sit to/from Nurse, children's Details (indicate cue type and reason): family reports pt has a seat at home, and has dificulty explaining home set up.  Deferred shower transfer to HHPT - family voices understanding to not shower until HHPT practices shower transfers with pt.    Functional mobility during ADLs: Min guard;Rolling walker General ADL Comments: reviewed safe technique for LB dressing with pt and family      Vision       Perception     Praxis      Cognition Arousal/Alertness: Awake/alert Behavior During Therapy: WFL for tasks assessed/performed Overall Cognitive Status: No family/caregiver present to determine baseline cognitive functioning Area of Impairment: Memory                     Memory: Decreased short-term memory         General Comments: Pt requires cues to recall precautions and for carry over of info         Exercises Total Joint Exercises Ankle Circles/Pumps: AROM;Both;10 reps Quad Sets:  (attempted, no palpable quad contraction though) Short Arc Quad: AAROM;Right;Supine;5 reps Heel Slides: AAROM;Right;10 reps;Supine Hip ABduction/ADduction: AAROM;Right;10 reps;Supine Straight Leg Raises: AAROM;Right;Supine;5 reps Goniometric ROM: 10-40* AAROM R knee   Shoulder Instructions       General Comments  Pertinent Vitals/ Pain       Pain Assessment: 0-10 Pain Score: 3  Pain Location: R knee with activity Pain Descriptors / Indicators: Aching Pain Intervention(s): Limited activity within patient's tolerance;Monitored during session  Home Living                                          Prior Functioning/Environment              Frequency  Min 2X/week        Progress Toward  Goals  OT Goals(current goals can now be found in the care plan section)  Progress towards OT goals: Progressing toward goals  Acute Rehab OT Goals Patient Stated Goal: to travel to visit family in [facility] Discharge plan remains appropriate    Co-evaluation                 AM-PAC PT "6 Clicks" Daily Activity     Outcome Measure   Help from another person eating meals?: None Help from another person taking care of personal grooming?: A Little Help from another person toileting, which includes using toliet, bedpan, or urinal?: A Little Help from another person bathing (including washing, rinsing, drying)?: A Little Help from another person to put on and taking off regular upper body clothing?: A Little Help from another person to put on and taking off regular lower body clothing?: A Lot 6 Click Score: 18    End of Session Equipment Utilized During Treatment: Rolling walker;Right knee immobilizer  OT Visit Diagnosis: Pain Pain - Right/Left: Right Pain - part of body: Knee   Activity Tolerance Patient tolerated treatment well   Patient Left in chair;with call bell/phone within reach;with family/visitor present   Nurse Communication Mobility status        Time: 4098-1191 OT Time Calculation (min): 26 min  Charges: OT General Charges $OT Visit: 1 Procedure OT Treatments $Self Care/Home Management : 23-37 mins  [employer], OTR/L [phone]    Jeani Hawking M 07/10/2017, 11:36 AM

## 2017-07-11 DIAGNOSIS — Z96651 Presence of right artificial knee joint: Secondary | ICD-10-CM | POA: Diagnosis not present

## 2017-07-11 DIAGNOSIS — Z471 Aftercare following joint replacement surgery: Secondary | ICD-10-CM | POA: Diagnosis not present

## 2017-07-11 DIAGNOSIS — M48061 Spinal stenosis, lumbar region without neurogenic claudication: Secondary | ICD-10-CM | POA: Diagnosis not present

## 2017-07-19 DIAGNOSIS — M48061 Spinal stenosis, lumbar region without neurogenic claudication: Secondary | ICD-10-CM | POA: Diagnosis not present

## 2017-07-19 DIAGNOSIS — Z96651 Presence of right artificial knee joint: Secondary | ICD-10-CM | POA: Diagnosis not present

## 2017-07-19 DIAGNOSIS — Z471 Aftercare following joint replacement surgery: Secondary | ICD-10-CM | POA: Diagnosis not present

## 2017-07-20 DIAGNOSIS — M1711 Unilateral primary osteoarthritis, right knee: Secondary | ICD-10-CM | POA: Diagnosis not present

## 2017-07-20 DIAGNOSIS — Z23 Encounter for immunization: Secondary | ICD-10-CM | POA: Diagnosis not present

## 2017-07-21 DIAGNOSIS — Z471 Aftercare following joint replacement surgery: Secondary | ICD-10-CM | POA: Diagnosis not present

## 2017-07-21 DIAGNOSIS — M48061 Spinal stenosis, lumbar region without neurogenic claudication: Secondary | ICD-10-CM | POA: Diagnosis not present

## 2017-07-21 DIAGNOSIS — Z96651 Presence of right artificial knee joint: Secondary | ICD-10-CM | POA: Diagnosis not present

## 2017-07-27 DIAGNOSIS — M25661 Stiffness of right knee, not elsewhere classified: Secondary | ICD-10-CM | POA: Diagnosis not present

## 2017-07-29 DIAGNOSIS — M25661 Stiffness of right knee, not elsewhere classified: Secondary | ICD-10-CM | POA: Diagnosis not present

## 2017-08-02 DIAGNOSIS — M25661 Stiffness of right knee, not elsewhere classified: Secondary | ICD-10-CM | POA: Diagnosis not present

## 2017-08-05 DIAGNOSIS — M25661 Stiffness of right knee, not elsewhere classified: Secondary | ICD-10-CM | POA: Diagnosis not present

## 2017-08-09 DIAGNOSIS — M25661 Stiffness of right knee, not elsewhere classified: Secondary | ICD-10-CM | POA: Diagnosis not present

## 2017-08-12 DIAGNOSIS — M25661 Stiffness of right knee, not elsewhere classified: Secondary | ICD-10-CM | POA: Diagnosis not present

## 2017-08-16 DIAGNOSIS — M25661 Stiffness of right knee, not elsewhere classified: Secondary | ICD-10-CM | POA: Diagnosis not present

## 2017-08-19 DIAGNOSIS — M25661 Stiffness of right knee, not elsewhere classified: Secondary | ICD-10-CM | POA: Diagnosis not present

## 2017-08-23 DIAGNOSIS — Z96651 Presence of right artificial knee joint: Secondary | ICD-10-CM | POA: Diagnosis not present

## 2017-08-23 DIAGNOSIS — Z471 Aftercare following joint replacement surgery: Secondary | ICD-10-CM | POA: Diagnosis not present

## 2017-09-19 DIAGNOSIS — R0989 Other specified symptoms and signs involving the circulatory and respiratory systems: Secondary | ICD-10-CM | POA: Diagnosis not present

## 2017-09-19 DIAGNOSIS — Z79899 Other long term (current) drug therapy: Secondary | ICD-10-CM | POA: Diagnosis not present

## 2017-10-03 DIAGNOSIS — R0781 Pleurodynia: Secondary | ICD-10-CM | POA: Diagnosis not present

## 2017-10-04 DIAGNOSIS — Z96651 Presence of right artificial knee joint: Secondary | ICD-10-CM | POA: Diagnosis not present

## 2017-10-04 DIAGNOSIS — Z471 Aftercare following joint replacement surgery: Secondary | ICD-10-CM | POA: Diagnosis not present

## 2017-12-11 IMAGING — DX DG KNEE 1-2V PORT*R*
2 series · 2 of 2 positions shown · non-contrast
Comparison: None.

CLINICAL DATA: Postop.

EXAM:
PORTABLE RIGHT KNEE - 1-2 VIEW

[knee lat (1 of 2)]
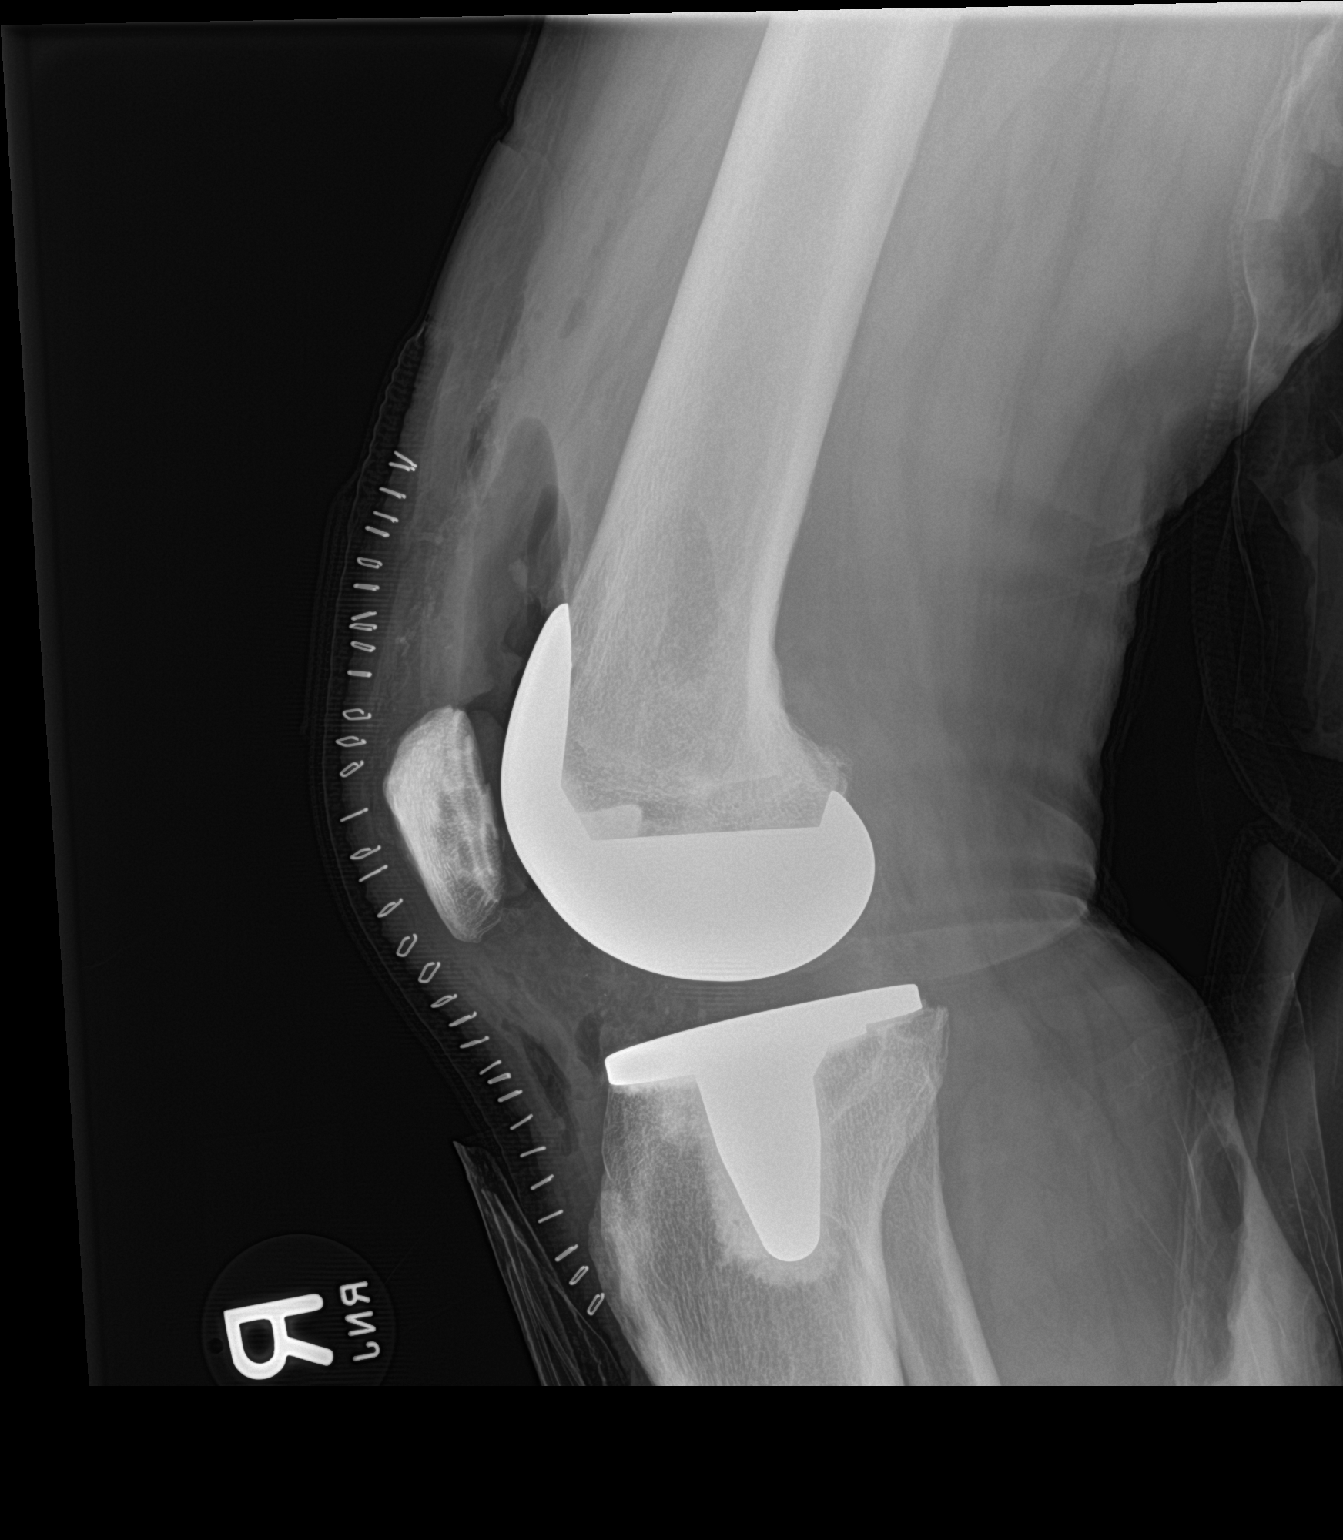

[knee lat (2 of 2)]
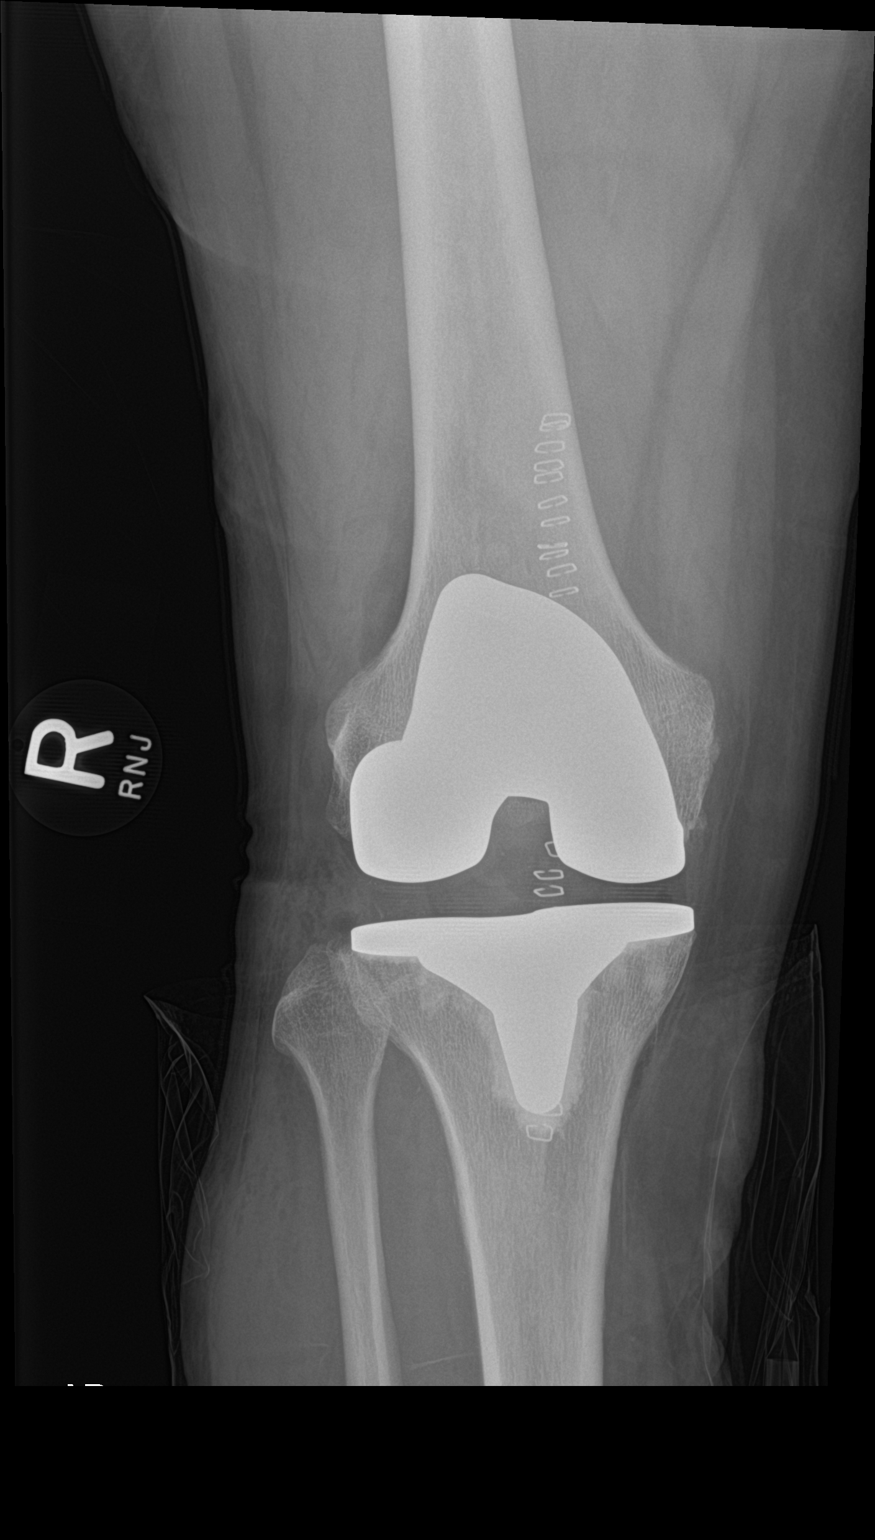

[2 of 2 positions shown; findings below may reference images not displayed]

FINDINGS: The patient is status post total knee replacement. Satisfactory
femoral and tibial components. Postoperative air is noted in the
joint space.
IMPRESSION: No adverse features.

## 2018-05-15 DIAGNOSIS — R197 Diarrhea, unspecified: Secondary | ICD-10-CM | POA: Diagnosis not present

## 2018-05-15 DIAGNOSIS — Z0000 Encounter for general adult medical examination without abnormal findings: Secondary | ICD-10-CM | POA: Diagnosis not present

## 2018-05-15 DIAGNOSIS — Z125 Encounter for screening for malignant neoplasm of prostate: Secondary | ICD-10-CM | POA: Diagnosis not present

## 2018-05-15 DIAGNOSIS — E039 Hypothyroidism, unspecified: Secondary | ICD-10-CM | POA: Diagnosis not present

## 2018-05-15 DIAGNOSIS — Z13220 Encounter for screening for lipoid disorders: Secondary | ICD-10-CM | POA: Diagnosis not present

## 2018-05-15 DIAGNOSIS — M179 Osteoarthritis of knee, unspecified: Secondary | ICD-10-CM | POA: Diagnosis not present

## 2018-07-13 DIAGNOSIS — S50862A Insect bite (nonvenomous) of left forearm, initial encounter: Secondary | ICD-10-CM | POA: Diagnosis not present

## 2018-07-13 DIAGNOSIS — W57XXXA Bitten or stung by nonvenomous insect and other nonvenomous arthropods, initial encounter: Secondary | ICD-10-CM | POA: Diagnosis not present

## 2018-07-18 ENCOUNTER — Other Ambulatory Visit: Payer: Self-pay | Admitting: Family Medicine

## 2018-07-18 ENCOUNTER — Ambulatory Visit
Admission: RE | Admit: 2018-07-18 | Discharge: 2018-07-18 | Disposition: A | Discharge status: Home / Self Care | Payer: 59 | Source: Ambulatory Visit | Attending: Family Medicine | Admitting: Family Medicine

## 2018-07-18 DIAGNOSIS — J984 Other disorders of lung: Secondary | ICD-10-CM | POA: Diagnosis not present

## 2018-07-18 DIAGNOSIS — R0781 Pleurodynia: Secondary | ICD-10-CM

## 2018-07-23 ENCOUNTER — Emergency Department (HOSPITAL_COMMUNITY)
Admission: EM | Admit: 2018-07-23 | Discharge: 2018-07-23 | Disposition: A | Discharge status: Home / Self Care | Payer: 59 | Attending: Emergency Medicine | Admitting: Emergency Medicine

## 2018-07-23 ENCOUNTER — Emergency Department (HOSPITAL_COMMUNITY): Payer: 59

## 2018-07-23 ENCOUNTER — Encounter (HOSPITAL_COMMUNITY): Payer: Self-pay | Admitting: Emergency Medicine

## 2018-07-23 DIAGNOSIS — Z79899 Other long term (current) drug therapy: Secondary | ICD-10-CM | POA: Insufficient documentation

## 2018-07-23 DIAGNOSIS — R0600 Dyspnea, unspecified: Secondary | ICD-10-CM | POA: Diagnosis not present

## 2018-07-23 DIAGNOSIS — R0789 Other chest pain: Secondary | ICD-10-CM | POA: Diagnosis not present

## 2018-07-23 DIAGNOSIS — Z96651 Presence of right artificial knee joint: Secondary | ICD-10-CM | POA: Insufficient documentation

## 2018-07-23 DIAGNOSIS — J189 Pneumonia, unspecified organism: Secondary | ICD-10-CM | POA: Diagnosis not present

## 2018-07-23 DIAGNOSIS — J181 Lobar pneumonia, unspecified organism: Secondary | ICD-10-CM | POA: Diagnosis not present

## 2018-07-23 DIAGNOSIS — Z87891 Personal history of nicotine dependence: Secondary | ICD-10-CM | POA: Diagnosis not present

## 2018-07-23 DIAGNOSIS — E039 Hypothyroidism, unspecified: Secondary | ICD-10-CM | POA: Insufficient documentation

## 2018-07-23 LAB — CBC WITH DIFFERENTIAL/PLATELET
BASOS PCT: 0 %
Basophils Absolute: 0 10*3/uL (ref 0.0–0.1)
Eosinophils Absolute: 0.1 10*3/uL (ref 0.0–0.7)
Eosinophils Relative: 2 %
HEMATOCRIT: 47.8 % (ref 39.0–52.0)
HEMOGLOBIN: 16 g/dL (ref 13.0–17.0)
LYMPHS ABS: 1.4 10*3/uL (ref 0.7–4.0)
Lymphocytes Relative: 15 %
MCH: 32.3 pg (ref 26.0–34.0)
MCHC: 33.5 g/dL (ref 30.0–36.0)
MCV: 96.4 fL (ref 78.0–100.0)
MONOS PCT: 7 %
Monocytes Absolute: 0.6 10*3/uL (ref 0.1–1.0)
NEUTROS ABS: 6.8 10*3/uL (ref 1.7–7.7)
NEUTROS PCT: 76 %
Platelets: 149 10*3/uL — ABNORMAL LOW (ref 150–400)
RBC: 4.96 MIL/uL (ref 4.22–5.81)
RDW: 12.9 % (ref 11.5–15.5)
WBC: 9 10*3/uL (ref 4.0–10.5)

## 2018-07-23 LAB — COMPREHENSIVE METABOLIC PANEL
ALK PHOS: 84 U/L (ref 38–126)
ALT: 20 U/L (ref 0–44)
ANION GAP: 8 (ref 5–15)
AST: 18 U/L (ref 15–41)
Albumin: 4.1 g/dL (ref 3.5–5.0)
BILIRUBIN TOTAL: 1.3 mg/dL — AB (ref 0.3–1.2)
BUN: 20 mg/dL (ref 8–23)
CALCIUM: 9.6 mg/dL (ref 8.9–10.3)
CO2: 29 mmol/L (ref 22–32)
CREATININE: 1.27 mg/dL — AB (ref 0.61–1.24)
Chloride: 106 mmol/L (ref 98–111)
GFR, EST NON AFRICAN AMERICAN: 54 mL/min — AB (ref >60)
Glucose, Bld: 109 mg/dL — ABNORMAL HIGH (ref 70–99)
Potassium: 4.7 mmol/L (ref 3.5–5.1)
Sodium: 143 mmol/L (ref 135–145)
TOTAL PROTEIN: 7 g/dL (ref 6.5–8.1)

## 2018-07-23 LAB — I-STAT TROPONIN, ED: Troponin i, poc: 0.02 ng/mL (ref 0.00–0.08)

## 2018-07-23 MED ORDER — IOPAMIDOL (ISOVUE-370) INJECTION 76%
100.0000 mL | Freq: Once | INTRAVENOUS | Status: AC | PRN
  Administered 2018-07-23: 100 mL via INTRAVENOUS

## 2018-07-23 MED ORDER — IOPAMIDOL (ISOVUE-370) INJECTION 76%
INTRAVENOUS | Status: AC
  Filled 2018-07-23: qty 100

## 2018-07-23 NOTE — ED Triage Notes (Signed)
Pt c/o back pain, difficulty lying down and sleeping r/t recent pneumonia diagnosis on 07/18/2018. Pt states he saw PMD  / [facility] and prescribed Levaquin and has one more day, but states pain in back is worsening.

## 2018-07-23 NOTE — Discharge Instructions (Addendum)
Please schedule an appointment with you PCP in 1 week for reevaluation of  your symptoms.If you your symptoms worsen or you experience any chest pain or shortness of breath please return to the Ed.

## 2018-07-23 NOTE — Care Management (Signed)
ED CM contacted concerning patient having some HH health needs, patient has been discharge from [facility] ED.  CM will follow up with patient and family tomorrow 9/9.

## 2018-07-23 NOTE — ED Provider Notes (Signed)
[facility]-EMERGENCY DEPT Provider Note   CSN: [id number] Arrival date & time: 07/23/18  4098     History   Chief Complaint Chief Complaint  Patient presents with  . Back Pain    HPI Dale Miller is a 72 y.o. male.  72 y.o male with no PMH presents to the ED with a chief complaint of left-sided chest pain x1 week. He began experiencing this left-sided chest pain on Monday and was seen by his PCP on Tuesday he was evaluated with a normal EKG, lab work, x-ray did showed pneumonia patient was placed on levofloxacin for treatment.  Today patient complains that his left-sided chest pain has worsened. He reports intermittent left-sided sharp chest pain no radiation.  He does report the pain is worse when he leans forward.  He states he cannot lay flat as it causes him to be short of breath and actually has been sleeping up on a recliner chair. Patient a former smoker and reports smoking for 1 year.  He denies any previous history of CAD, he has not had any cardiac work-up done recently.  He denies any fever,abdominal pain or back pain.      Past Medical History:  Diagnosis Date  . Hypothyroidism   . Right knee meniscal tear   . Wears glasses   . Wears hearing aid    BILATERAL    Patient Active Problem List   Diagnosis Date Noted  . Primary osteoarthritis of right knee 07/07/2017  . Right knee DJD 07/07/2017    Past Surgical History:  Procedure Laterality Date  . APPENDECTOMY  as child  . KNEE ARTHROSCOPY WITH MEDIAL MENISECTOMY Right 09/06/2014   Procedure: RIGHT KNEE ARTHROSCOPY WITH PARTIAL MEDIAL AND LATERAL MENISECTOMY WITH DEBRIDEMENT;  Surgeon: Javier Docker, MD;  Location: [facility];  Service: Orthopedics;  Laterality: Right;  . TONSILLECTOMY  as child  . TOTAL KNEE ARTHROPLASTY Right 07/07/2017   Procedure: TOTAL RIGHT KNEE ARTHROPLASTY;  Surgeon: Jene Every, MD;  Location: [facility];  Service: Orthopedics;  Laterality: Right;          Home Medications    Prior to Admission medications   Medication Sig Start Date End Date Taking? Authorizing Provider  levofloxacin (LEVAQUIN) 750 MG tablet Take 750 mg by mouth daily. 07/18/18  Yes [provider]  levothyroxine (SYNTHROID, LEVOTHROID) 50 MCG tablet Take 50 mcg by mouth daily before breakfast. 04/08/17  Yes [provider]  oxyCODONE-acetaminophen (PERCOCET) 5-325 MG tablet Take 1-2 tablets by mouth every 4 (four) hours as needed for severe pain. 07/07/17  Yes Jene Every, MD  terbinafine (LAMISIL) 250 MG tablet Take 250 mg by mouth daily. 05/15/18  Yes [provider]  aspirin EC 325 MG tablet Take 1 tablet (325 mg total) by mouth 2 (two) times daily. Patient not taking: Reported on 07/23/2018 07/07/17   Jene Every, MD  docusate sodium (COLACE) 100 MG capsule Take 1 capsule (100 mg total) by mouth 2 (two) times daily as needed for mild constipation. Patient not taking: Reported on 07/23/2018 07/07/17   Jene Every, MD  polyethylene glycol [facility] / Ethelene Hal) packet Take 17 g by mouth daily. Patient not taking: Reported on 07/23/2018 07/07/17   Jene Every, MD    Family History No family history on file.  Social History Social History   Tobacco Use  . Smoking status: Former Smoker    Last attempt to quit: 11/16/1975    Years since quitting: 42.7  . Smokeless  tobacco: Never Used  Substance Use Topics  . Alcohol use: Yes    Alcohol/week: 1.0 standard drinks    Types: 1 Cans of beer per week    Comment: beer daily and liquor per week  . Drug use: No     Allergies   Patient has no allergy information on record.   Review of Systems Review of Systems  Constitutional: Negative for chills and fever.  HENT: Negative for ear pain and sore throat.   Eyes: Negative for pain and visual disturbance.  Respiratory: Positive for shortness of breath. Negative for cough.   Cardiovascular: Positive for chest pain. Negative for  palpitations.  Gastrointestinal: Negative for abdominal pain and vomiting.  Genitourinary: Negative for dysuria and hematuria.  Musculoskeletal: Positive for back pain (left sided thoracic region). Negative for arthralgias.  Skin: Negative for color change and rash.  Neurological: Negative for seizures and syncope.  All other systems reviewed and are negative.    Physical Exam Updated Vital Signs BP 131/84   Pulse 89   Temp 98 F (36.7 C)   Resp (!) 21   SpO2 100%   Physical Exam  Constitutional: He is oriented to person, place, and time. He appears well-developed and well-nourished.  HENT:  Head: Normocephalic and atraumatic.  Eyes: Pupils are equal, round, and reactive to light.  Neck: Normal range of motion. Neck supple.  Cardiovascular: Normal heart sounds.  Pulmonary/Chest: He has decreased breath sounds. He has rales in the left lower field.  Rales present on Left lower lobe. Decreased breath sounds on right lower lobe.  Abdominal: Soft. Bowel sounds are normal. There is no tenderness.  Musculoskeletal: He exhibits no tenderness.       Arms: Left-sided flank pain nonreproducible with palpation, denies any urinary symptoms.  Neurological: He is alert and oriented to person, place, and time.  Skin: Skin is warm and dry.  Nursing note and vitals reviewed.    ED Treatments / Results  Labs (all labs ordered are listed, but only abnormal results are displayed) Labs Reviewed  CBC WITH DIFFERENTIAL/PLATELET - Abnormal; Notable for the following components:      Result Value   Platelets 149 (*)    All other components within normal limits  COMPREHENSIVE METABOLIC PANEL - Abnormal; Notable for the following components:   Glucose, Bld 109 (*)    Creatinine, Ser 1.27 (*)    Total Bilirubin 1.3 (*)    GFR calc non Af Amer 54 (*)    All other components within normal limits  I-STAT TROPONIN, ED    EKG EKG Interpretation  Date/Time:  Sunday July 23 2018 10:35:52  EDT Ventricular Rate:  74 PR Interval:    QRS Duration: 89 QT Interval:  407 QTC Calculation: 452 R Axis:   42 Text Interpretation:  Sinus rhythm Confirmed by Tilden Fossa [phone]) on 07/23/2018 10:37:43 AM Also confirmed by Alvira Monday ([id number])  on 07/23/2018 11:35:09 AM Also confirmed by Alvira Monday ([id number]), editor Barbette Hair [phone])  on 07/23/2018 12:38:01 PM   Radiology Dg Chest 2 View  Result Date: 07/23/2018 CLINICAL DATA:  Low back pain.  Recent pneumonia. EXAM: CHEST - 2 VIEW COMPARISON:  July 18, 2018 FINDINGS: Mild opacity in left base is mildly improved since July 18, 2018. No other interval changes or acute abnormalities. IMPRESSION: Persistent but improved left basilar opacity. Recommend follow-up to resolution. Electronically Signed   By: Gerome Sam III M.D   On: 07/23/2018 11:16   Ct Angio Chest  Pe W And/or Wo Contrast  Result Date: 07/23/2018 CLINICAL DATA:  Back pain.  Recent pneumonia. EXAM: CT ANGIOGRAPHY CHEST WITH CONTRAST TECHNIQUE: Multidetector CT imaging of the chest was performed using the standard protocol during bolus administration of intravenous contrast. Multiplanar CT image reconstructions and MIPs were obtained to evaluate the vascular anatomy. CONTRAST:  ISOVUE-370 IOPAMIDOL (ISOVUE-370) INJECTION 76% COMPARISON:  Chest x-ray July 23, 2018 FINDINGS: Cardiovascular: The heart size borderline. The thoracic aorta is nonaneurysmal with no dissection. Evaluation of more peripheral pulmonary arteries is somewhat limited due to respiratory motion and stairstep artifact. Within this limitation, no pulmonary emboli identified. Mediastinum/Nodes: There is a small left pleural effusion. No right pleural effusion or pericardial effusion. The esophagus and thyroid are normal. No adenopathy in the chest. Lungs/Pleura: Central airways are normal. No pneumothorax. Mild atelectasis in the lingula. Small left effusion with underlying atelectasis. No  evidence of pneumonia. No pulmonary nodules, masses, or suspicious infiltrates. Upper Abdomen: No acute abnormality. Musculoskeletal: Degenerative changes in the thoracic spine are identified with anterior osteophytes. No malalignment. No fractures identified. Review of the MIP images confirms the above findings. IMPRESSION: 1. Evaluation of peripheral pulmonary arteries is limited due to respiratory motion. Within this limitation, no pulmonary emboli are identified. 2. Small left pleural effusion with underlying atelectasis. Mild lingular atelectasis. These findings likely account for chest x-ray findings. 3. Degenerative changes in the thoracic spine. Electronically Signed   By: Gerome Sam III M.D   On: 07/23/2018 15:11    Procedures Procedures (including critical care time)  Medications Ordered in ED Medications  iopamidol (ISOVUE-370) 76 % injection (has no administration in time range)  iopamidol (ISOVUE-370) 76 % injection 100 mL (100 mLs Intravenous Contrast Given 07/23/18 1450)     Initial Impression / Assessment and Plan / ED Course  I have reviewed the triage vital signs and the nursing notes.  Pertinent labs & imaging results that were available during my care of the patient were reviewed by me and considered in my medical decision making (see chart for details).     Patient presents with chest pain which began on Monday.  Patient was seen by his PCP on Tuesday where he had an EKG, blood work, x-ray which were all negative except for the x-ray showed pneumonia, his PCP is treating him with levofloxacin. Presents today with worsening pain especially when he is laying down or leaning forward, he states he is unable to sleep on his back as he gets short of breath and has been sleeping in a recliner sitting up.  EKG obtained, no PR depressions.  No STEMI.  Troponin was negative.DG chest showed some left basilar consolidation improving from previous x-ray on September 3.  Patient's  presentation is consistent with pericarditis however EKG inconsistent.  Have discussed this case with Dr. Dalene Seltzer has evaluated and examined the patient, at this time we patient may be more consistent with PE will order CT angios chest PE to rule out pulmonary embolism.  Discussed results with patient CT is negative for PE, but shows a small pleural effusion.Patient has been on antibiotics for pneumonia, having his last dose today.I have advised patient to follow up with his PCP this week.  3:44 PM Patient and grandson requesting case management for home health. At this time she has been in the ED for several hours will send a note to case management to follow up with patient on outpatient basis.   Final Clinical Impressions(s) / ED Diagnoses   Final  diagnoses:  Community acquired pneumonia of left lower lobe of lung (HCC)  Dyspnea, unspecified type    ED Discharge Orders    None       Claude Manges, PA-C 07/23/18 1611    Alvira Monday, MD 07/24/18 [phone]

## 2018-07-23 NOTE — ED Notes (Signed)
Per Dalene Seltzer, MD patient can eat.

## 2018-07-25 DIAGNOSIS — J181 Lobar pneumonia, unspecified organism: Secondary | ICD-10-CM | POA: Diagnosis not present

## 2018-08-15 DIAGNOSIS — J181 Lobar pneumonia, unspecified organism: Secondary | ICD-10-CM | POA: Diagnosis not present

## 2018-09-11 DIAGNOSIS — Z23 Encounter for immunization: Secondary | ICD-10-CM | POA: Diagnosis not present

## 2018-09-29 DIAGNOSIS — L570 Actinic keratosis: Secondary | ICD-10-CM | POA: Diagnosis not present

## 2018-09-29 DIAGNOSIS — R05 Cough: Secondary | ICD-10-CM | POA: Diagnosis not present

## 2018-09-29 DIAGNOSIS — R0989 Other specified symptoms and signs involving the circulatory and respiratory systems: Secondary | ICD-10-CM | POA: Diagnosis not present

## 2018-12-15 DIAGNOSIS — R0982 Postnasal drip: Secondary | ICD-10-CM | POA: Diagnosis not present

## 2018-12-15 DIAGNOSIS — R198 Other specified symptoms and signs involving the digestive system and abdomen: Secondary | ICD-10-CM | POA: Diagnosis not present

## 2019-01-29 DIAGNOSIS — J4520 Mild intermittent asthma, uncomplicated: Secondary | ICD-10-CM | POA: Diagnosis not present

## 2019-01-29 DIAGNOSIS — L570 Actinic keratosis: Secondary | ICD-10-CM | POA: Diagnosis not present

## 2019-01-29 DIAGNOSIS — R21 Rash and other nonspecific skin eruption: Secondary | ICD-10-CM | POA: Diagnosis not present

## 2019-03-17 DIAGNOSIS — S61451A Open bite of right hand, initial encounter: Secondary | ICD-10-CM | POA: Diagnosis not present

## 2019-03-17 DIAGNOSIS — L03113 Cellulitis of right upper limb: Secondary | ICD-10-CM | POA: Diagnosis not present

## 2019-03-19 DIAGNOSIS — S61451D Open bite of right hand, subsequent encounter: Secondary | ICD-10-CM | POA: Diagnosis not present

## 2019-05-24 ENCOUNTER — Emergency Department (HOSPITAL_COMMUNITY)
Admission: EM | Admit: 2019-05-24 | Discharge: 2019-05-24 | Disposition: A | Discharge status: Home / Self Care | Payer: 59 | Attending: Emergency Medicine | Admitting: Emergency Medicine

## 2019-05-24 ENCOUNTER — Encounter (HOSPITAL_COMMUNITY): Payer: Self-pay

## 2019-05-24 ENCOUNTER — Other Ambulatory Visit: Payer: Self-pay

## 2019-05-24 ENCOUNTER — Emergency Department (HOSPITAL_COMMUNITY): Payer: 59

## 2019-05-24 DIAGNOSIS — E039 Hypothyroidism, unspecified: Secondary | ICD-10-CM | POA: Insufficient documentation

## 2019-05-24 DIAGNOSIS — M545 Low back pain, unspecified: Secondary | ICD-10-CM

## 2019-05-24 DIAGNOSIS — Z79899 Other long term (current) drug therapy: Secondary | ICD-10-CM | POA: Diagnosis not present

## 2019-05-24 DIAGNOSIS — Z87891 Personal history of nicotine dependence: Secondary | ICD-10-CM | POA: Insufficient documentation

## 2019-05-24 MED ORDER — DEXAMETHASONE SODIUM PHOSPHATE 10 MG/ML IJ SOLN
10.0000 mg | Freq: Once | INTRAMUSCULAR | Status: AC
  Administered 2019-05-24: 03:00:00 10 mg via INTRAMUSCULAR
  Filled 2019-05-24: qty 1

## 2019-05-24 MED ORDER — KETOROLAC TROMETHAMINE 60 MG/2ML IM SOLN
30.0000 mg | Freq: Once | INTRAMUSCULAR | Status: AC
  Administered 2019-05-24: 03:00:00 30 mg via INTRAMUSCULAR
  Filled 2019-05-24: qty 2

## 2019-05-24 NOTE — ED Notes (Signed)
Bed: [facility] Expected date:  Expected time:  Means of arrival:  Comments: 

## 2019-05-24 NOTE — Discharge Instructions (Signed)
You may use over-the-counter Motrin (Ibuprofen), Acetaminophen (Tylenol), topical muscle creams such as SalonPas, Icy Hot, Bengay, etc. Please stretch, apply heat, and have massage therapy for additional assistance. ° °

## 2019-05-24 NOTE — ED Provider Notes (Signed)
[facility] Provider [facility]  CSN: [id number] Arrival date & time: 05/24/19 0127  Chief Complaint(s) Back Pain  HPI Dale BoastJames Miller is a 73 y.o. male   The history is provided by the patient.  Back Pain Location:  Lumbar spine Quality:  Aching and cramping Radiates to:  Does not radiate Pain severity now: intially mild; severe since tonight. Onset quality:  Gradual Duration:  2 weeks Timing:  Constant Progression:  Waxing and waning Context: not falling, not occupational injury, not physical stress, not recent illness, not recent injury and not twisting   Relieved by:  Being still Worsened by:  Bending, twisting and movement Associated symptoms: no bladder incontinence, no bowel incontinence, no dysuria, no fever, no headaches, no leg pain, no numbness, no paresthesias, no perianal numbness and no weakness   Risk factors: no hx of cancer, no recent surgery and no steroid use     Past Medical History Past Medical History:  Diagnosis Date  . Hypothyroidism   . Right knee meniscal tear   . Wears glasses   . Wears hearing aid    BILATERAL   Patient Active Problem List   Diagnosis Date Noted  . Primary osteoarthritis of right knee 07/07/2017  . Right knee DJD 07/07/2017   Home Medication(s) Prior to Admission medications   Medication Sig Start Date End Date Taking? Authorizing Provider  cetirizine (ZYRTEC) 10 MG tablet Take 10 mg by mouth daily.   Yes [provider]  hydrOXYzine (ATARAX/VISTARIL) 25 MG tablet Take 25 mg by mouth every 8 (eight) hours as needed for itching.   Yes [provider]  levothyroxine (SYNTHROID, LEVOTHROID) 50 MCG tablet Take 50 mcg by mouth daily before breakfast. 04/08/17  Yes [provider]  meloxicam (MOBIC) 15 MG tablet Take 15 mg by mouth daily.   Yes [provider]  Multiple Vitamins-Minerals (MULTIVITAMIN MEN 50+) TABS Take 1 tablet by mouth daily.   Yes [provider]  tiZANidine (ZANAFLEX) 2 MG tablet Take 2-4 mg by mouth every 6 (six) hours as needed for muscle spasms.   Yes [provider]  aspirin EC 325 MG tablet Take 1 tablet (325 mg total) by mouth 2 (two) times daily. Patient not taking: Reported on 07/23/2018 07/07/17   [name], [name], MD  docusate sodium (COLACE) 100 MG capsule Take 1 capsule (100 mg total) by mouth 2 (two) times daily as needed for mild constipation. Patient not taking: Reported on 05/24/2019 07/07/17   [name], [name], MD  oxyCODONE-acetaminophen (PERCOCET) 5-325 MG tablet Take 1-2 tablets by mouth every 4 (four) hours as needed for severe pain. Patient not taking: Reported on 05/24/2019 07/07/17   [name], [name], MD  polyethylene glycol [facility](MIRALAX / Ethelene HalGLYCOLAX) packet Take 17 g by mouth daily. Patient not taking: Reported on 05/24/2019 07/07/17   [name], [name], MD  Past Surgical History Past Surgical History:  Procedure Laterality Date  . APPENDECTOMY  as child  . KNEE ARTHROSCOPY WITH MEDIAL MENISECTOMY Right 09/06/2014   Procedure: RIGHT KNEE ARTHROSCOPY WITH PARTIAL MEDIAL AND LATERAL MENISECTOMY WITH DEBRIDEMENT;  Surgeon: Javier DockerJeffrey C Beane, MD;  [facility];  Service: Orthopedics;  Laterality: Right;  . TONSILLECTOMY  as child  . TOTAL KNEE ARTHROPLASTY Right 07/07/2017   Procedure: TOTAL RIGHT KNEE ARTHROPLASTY;  Surgeon: [name], [name], MD;  Location: [facility];  Service: Orthopedics;  Laterality: Right;   Family History No family history on file.  Social History Social History   Tobacco Use  . Smoking status: Former Smoker    Quit date: 11/16/1975    Years since quitting: 43.5  . Smokeless tobacco: Never Used  Substance Use Topics  . Alcohol use: Yes    Alcohol/week: 1.0 standard drinks    Types: 1 Cans of beer per week    Comment: beer daily and 300ml  liquor per week  . Drug use: No   Allergies Patient has no known allergies.  Review of Systems Review of Systems  Constitutional: Negative for fever.  Gastrointestinal: Negative for bowel incontinence.  Genitourinary: Negative for bladder incontinence and dysuria.  Musculoskeletal: Positive for back pain.  Neurological: Negative for weakness, numbness, headaches and paresthesias.   All other systems are reviewed and are negative for acute change except as noted in the HPI  Physical Exam Vital Signs  I have reviewed the triage vital signs BP (!) 154/78   Pulse 78   Temp 98 F (36.7 C) (Oral)   Resp 18   SpO2 96%   Physical Exam Vitals signs reviewed.  Constitutional:      General: He is not in acute distress.    Appearance: He is well-developed. He is not diaphoretic.  HENT:     Head: Normocephalic and atraumatic.     Jaw: No trismus.     Right Ear: External ear normal.     Left Ear: External ear normal.     Nose: Nose normal.  Eyes:     General: No scleral icterus.    Conjunctiva/sclera: Conjunctivae normal.  Neck:     Musculoskeletal: Normal range of motion.     Trachea: Phonation normal.  Cardiovascular:     Rate and Rhythm: Normal rate and regular rhythm.  Pulmonary:     Effort: Pulmonary effort is normal. No respiratory distress.     Breath sounds: No stridor.  Abdominal:     General: There is no distension.  Musculoskeletal: Normal range of motion.     Lumbar back: He exhibits tenderness. He exhibits no bony tenderness.       Back:  Neurological:     Mental Status: He is alert and oriented to person, place, and time.     Comments: Spine Exam: Strength: 5/5 throughout LE bilaterally  Sensation: Intact to light touch in proximal and distal LE bilaterally Reflexes: 1+ quadriceps and achilles reflexes   Psychiatric:        Behavior: Behavior normal.     ED Results and Treatments Labs (all labs ordered are listed, but only abnormal results are  displayed) Labs Reviewed - No data to display  EKG  EKG Interpretation  Date/Time:    Ventricular Rate:    PR Interval:    QRS Duration:   QT Interval:    QTC Calculation:   R Axis:     Text Interpretation:        Radiology Dg Lumbar Spine 2-3 Views  Result Date: 05/24/2019 CLINICAL DATA:  Back pain EXAM: LUMBAR SPINE - 2-3 VIEW COMPARISON:  None. FINDINGS: There is no acute fracture. No dislocation. Multilevel degenerative changes are noted throughout the lumbar spine, greatest at the L5-S1 level. There is multilevel facet arthrosis. There are large bridging osteophytes, most notably at the L2-L3 level. IMPRESSION: No acute osseous abnormality. Electronically Signed   By: Constance Holster M.D.   On: 05/24/2019 03:00    Pertinent labs & imaging results that were available during my care of the patient were reviewed by me and considered in my medical decision making (see chart for details).  Medications Ordered in ED Medications  ketorolac (TORADOL) injection 30 mg (30 mg Intramuscular Given 05/24/19 0248)  dexamethasone (DECADRON) injection 10 mg (10 mg Intramuscular Given 05/24/19 0250)                                                                                                                                    Procedures Procedures  (including critical care time)  Medical Decision Making / ED Course I have reviewed the nursing notes for this encounter and the patient's prior records (if available in EHR or on provided paperwork).   Dale Miller was evaluated in Emergency Department on 05/24/2019 for the symptoms described in the history of present illness. He was evaluated in the context of the global COVID-19 pandemic, which necessitated consideration that the patient might be at risk for infection with the SARS-CoV-2 virus that causes COVID-19. Institutional  protocols and algorithms that pertain to the evaluation of patients at risk for COVID-19 are in a state of rapid change based on information released by regulatory bodies including the CDC and federal and state organizations. These policies and algorithms were followed during the patient's care in the ED.  73 y.o. male presents with back pain in lumbar area for 2 weeks without signs of radicular pain. No acute traumatic onset. No red flag symptoms of fever, weight loss, saddle anesthesia, weakness, fecal/urinary incontinence or urinary retention.   Suspect MSK etiology. No indication for imaging emergently. Patient was recommended to take short course of scheduled NSAIDs and engage in early mobility as definitive treatment. Return precautions discussed for worsening or new concerning symptoms.   The patient appears reasonably screened and/or stabilized for discharge and I doubt any other medical condition or other [facility] requiring further screening, evaluation, or treatment in the ED at this time prior to discharge.  The patient is safe for discharge with strict return precautions.      Final Clinical Impression(s) / ED Diagnoses Final diagnoses:  Acute right-sided low back  pain without sciatica     The patient appears reasonably screened and/or stabilized for discharge and I doubt any other medical condition or other [facility] requiring further screening, evaluation, or treatment in the ED at this time prior to discharge.  Disposition: Discharge  Condition: Good  I have discussed the results, Dx and Tx plan with the patient who expressed understanding and agree(s) with the plan. Discharge instructions discussed at great length. The patient was given strict return precautions who verbalized understanding of the instructions. No further questions at time of discharge.    ED Discharge Orders    None      Follow Up: Daisy Florooss, Charles Alan, MD [address] [place] [id number] [phone]   Schedule an appointment as soon as possible for a visit       This chart was dictated using voice recognition software.  Despite best efforts to proofread,  errors can occur which can change the documentation meaning.   Nira Connardama, Pedro Eduardo, MD 05/24/19 [phone]

## 2019-05-24 NOTE — ED Notes (Signed)
Pt screams when he has the pains in his back.

## 2019-05-24 NOTE — ED Notes (Signed)
Pt transported to Xray. 

## 2019-05-24 NOTE — ED Triage Notes (Signed)
Pt BIB [facility] from home. Pt c/o right lower back pain starting at 1830 tonight. Pt reports that his back has been aching x2 weeks, tonight it got worse. When pt got up at 2300 to use the bathroom, it was "bad enough to call EMS". Pt reports taking Oxycodone at 1830 and hydrocodone at 2300 with no relief.

## 2019-10-28 IMAGING — CR LUMBAR SPINE - 2-3 VIEW
3 series · 3 of 3 positions shown · non-contrast
Comparison: None.

CLINICAL DATA: Back pain

EXAM:
LUMBAR SPINE - 2-3 VIEW

[t lumbar spine lat]
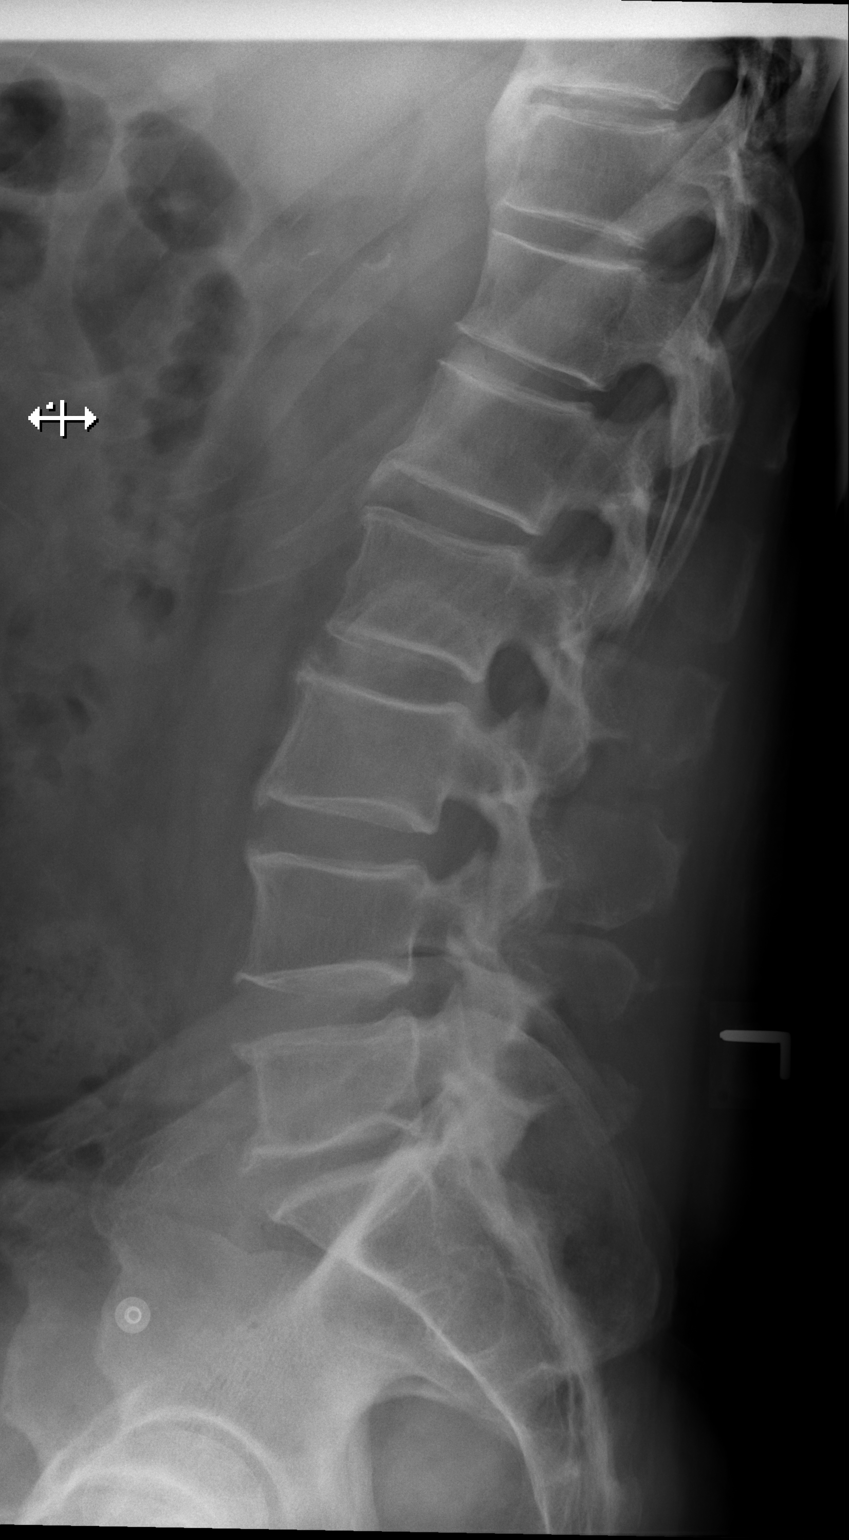

[t lumbar l-5 s-1 spot]
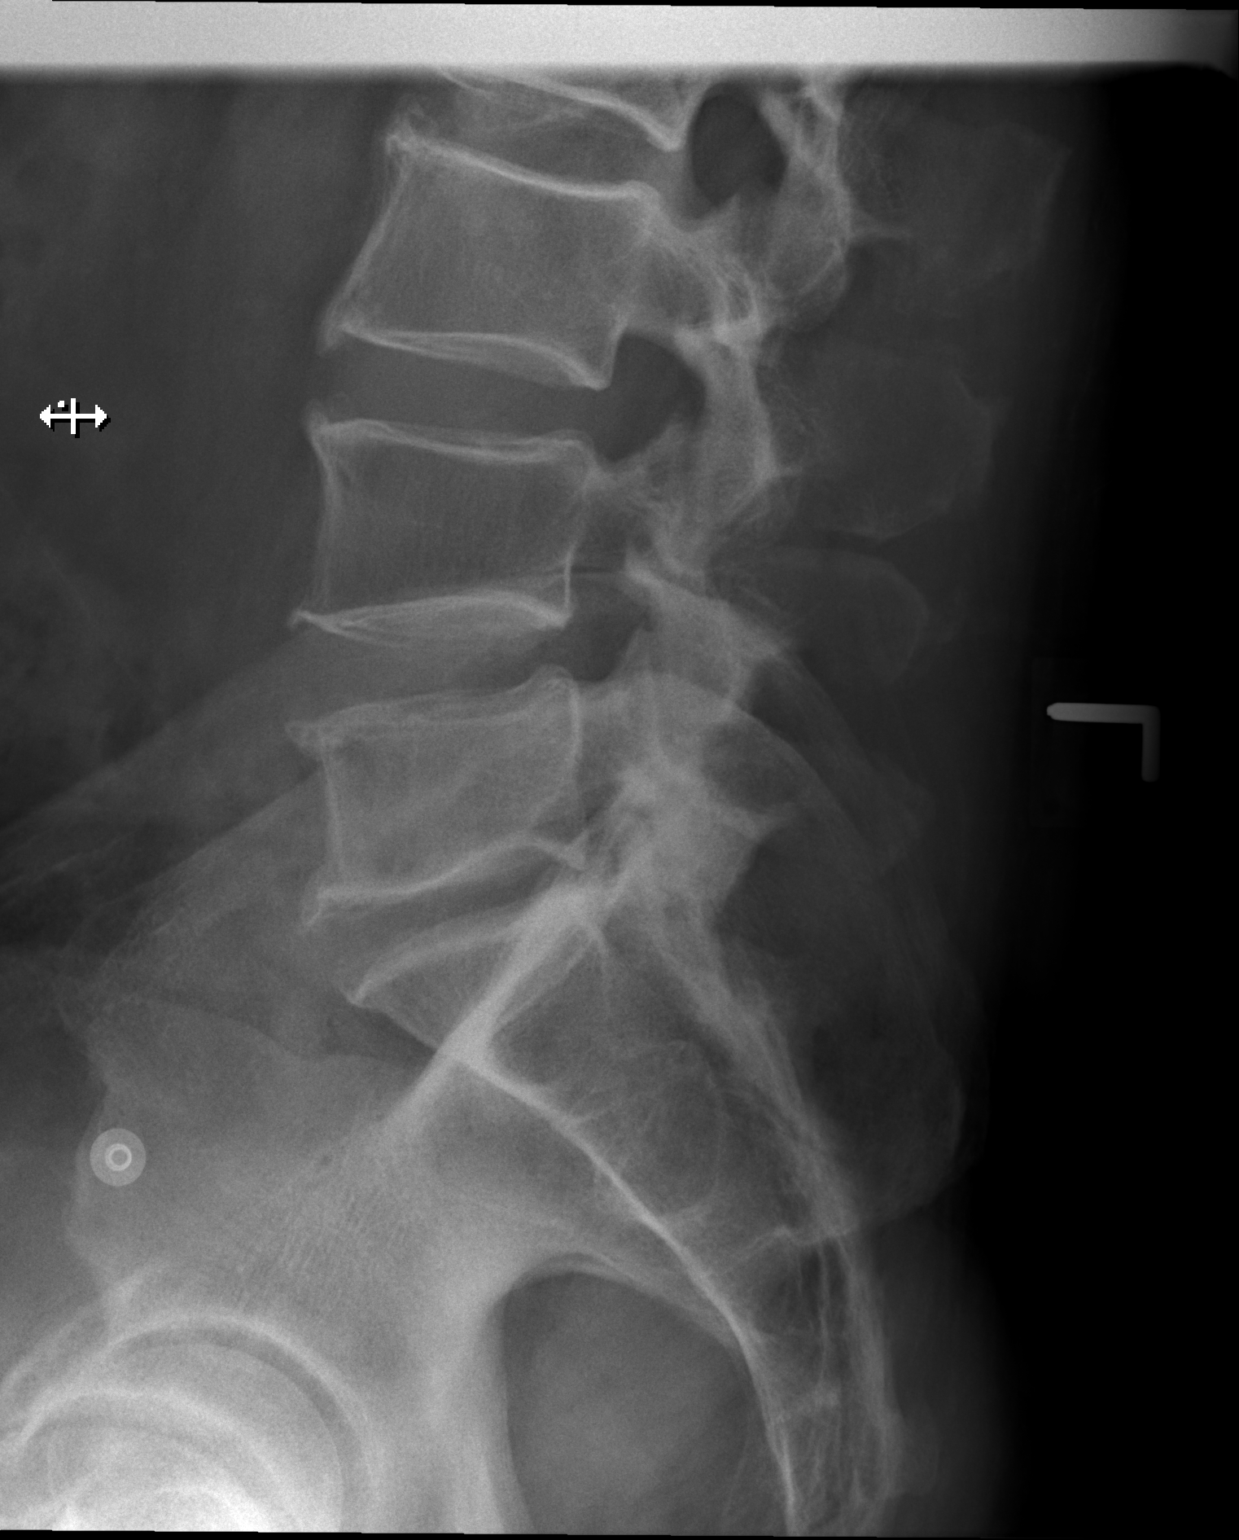

[t lumbar spine ap]
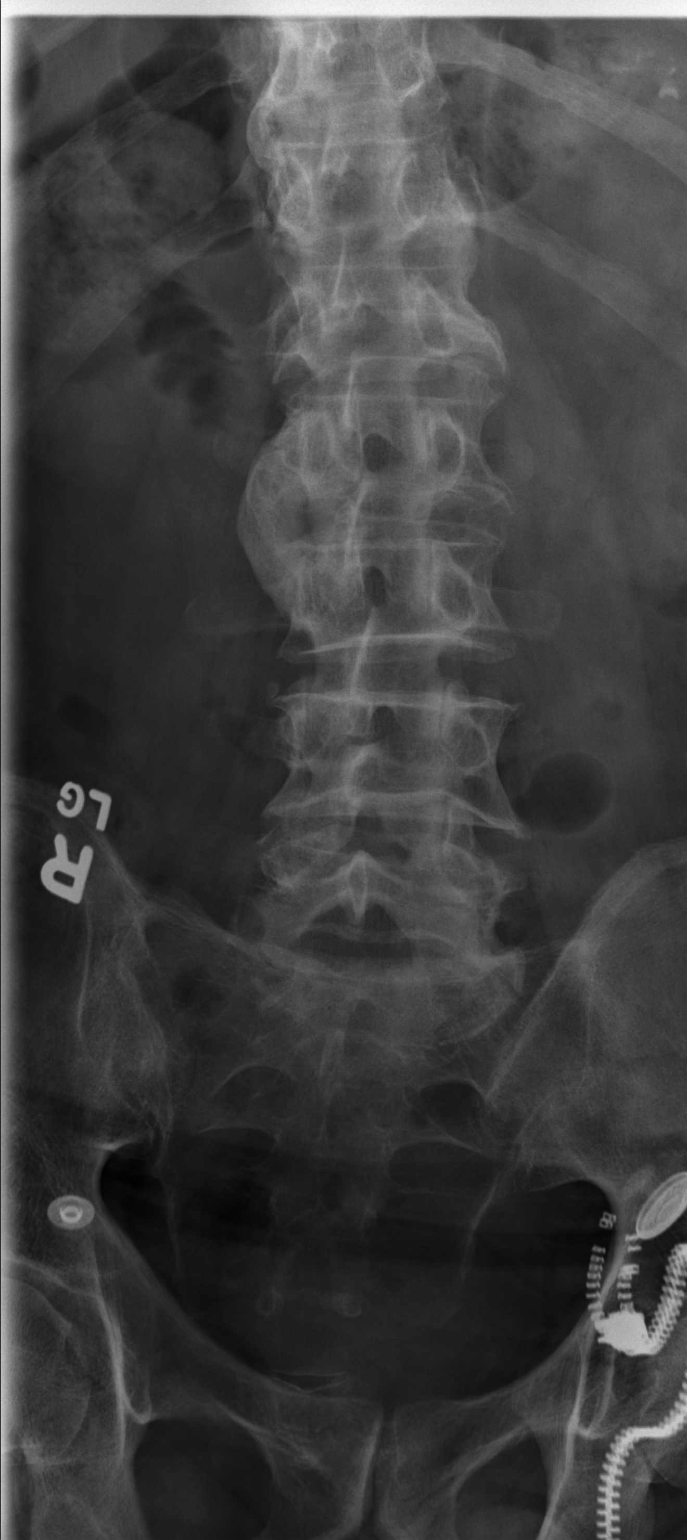

[3 of 3 positions shown; findings below may reference images not displayed]

FINDINGS: There is no acute fracture. No dislocation. Multilevel degenerative
changes are noted throughout the lumbar spine, greatest at the L5-S1
level. There is multilevel facet arthrosis. There are large bridging
osteophytes, most notably at the L2-L3 level.
IMPRESSION: No acute osseous abnormality.

## 2020-12-16 DEATH — deceased
